# Patient Record
Sex: Female | Born: 1965 | Race: White | Hispanic: No | Smoking: Former smoker
Health system: Southern US, Community
[De-identification: ages and names within clinical notes are randomized; demographics above are authoritative.]

## PROBLEM LIST (undated history)

## (undated) DIAGNOSIS — Z87442 Personal history of urinary calculi: Secondary | ICD-10-CM

## (undated) DIAGNOSIS — K219 Gastro-esophageal reflux disease without esophagitis: Secondary | ICD-10-CM

## (undated) DIAGNOSIS — R011 Cardiac murmur, unspecified: Secondary | ICD-10-CM

## (undated) DIAGNOSIS — D649 Anemia, unspecified: Secondary | ICD-10-CM

## (undated) DIAGNOSIS — I10 Essential (primary) hypertension: Secondary | ICD-10-CM

## (undated) DIAGNOSIS — Z8489 Family history of other specified conditions: Secondary | ICD-10-CM

## (undated) DIAGNOSIS — T7840XA Allergy, unspecified, initial encounter: Secondary | ICD-10-CM

## (undated) HISTORY — PX: TUBAL LIGATION: SHX77

## (undated) HISTORY — DX: Cardiac murmur, unspecified: R01.1

## (undated) HISTORY — PX: HYSTEROSCOPY: SHX211

## (undated) HISTORY — DX: Allergy, unspecified, initial encounter: T78.40XA

## (undated) HISTORY — PX: TONSILLECTOMY: SUR1361

## (undated) HISTORY — PX: OOPHORECTOMY: SHX86

## (undated) HISTORY — PX: OTHER SURGICAL HISTORY: SHX169

## (undated) HISTORY — PX: CHOLECYSTECTOMY: SHX55

---

## 1998-05-24 ENCOUNTER — Encounter: Payer: Self-pay | Admitting: Emergency Medicine

## 1998-05-24 ENCOUNTER — Emergency Department (HOSPITAL_COMMUNITY): Admission: EM | Admit: 1998-05-24 | Discharge: 1998-05-24 | Payer: Self-pay | Admitting: Emergency Medicine

## 1998-12-18 ENCOUNTER — Other Ambulatory Visit: Admission: RE | Admit: 1998-12-18 | Discharge: 1998-12-18 | Payer: Self-pay | Admitting: Gynecology

## 1998-12-31 ENCOUNTER — Encounter: Payer: Self-pay | Admitting: Gynecology

## 1998-12-31 ENCOUNTER — Ambulatory Visit (HOSPITAL_COMMUNITY): Admission: RE | Admit: 1998-12-31 | Discharge: 1998-12-31 | Payer: Self-pay | Admitting: Gynecology

## 1999-01-20 ENCOUNTER — Inpatient Hospital Stay (HOSPITAL_COMMUNITY): Admission: RE | Admit: 1999-01-20 | Discharge: 1999-01-23 | Payer: Self-pay | Admitting: Gynecology

## 1999-01-20 ENCOUNTER — Encounter (INDEPENDENT_AMBULATORY_CARE_PROVIDER_SITE_OTHER): Payer: Self-pay

## 1999-08-05 ENCOUNTER — Encounter: Payer: Self-pay | Admitting: Gynecology

## 1999-08-05 ENCOUNTER — Encounter: Admission: RE | Admit: 1999-08-05 | Discharge: 1999-08-05 | Payer: Self-pay | Admitting: Gynecology

## 2000-04-25 ENCOUNTER — Other Ambulatory Visit: Admission: RE | Admit: 2000-04-25 | Discharge: 2000-04-25 | Payer: Self-pay | Admitting: Gynecology

## 2000-10-01 ENCOUNTER — Observation Stay (HOSPITAL_COMMUNITY): Admission: RE | Admit: 2000-10-01 | Discharge: 2000-10-02 | Payer: Self-pay | Admitting: Urology

## 2000-10-05 ENCOUNTER — Emergency Department (HOSPITAL_COMMUNITY): Admission: EM | Admit: 2000-10-05 | Discharge: 2000-10-05 | Payer: Self-pay | Admitting: Emergency Medicine

## 2000-10-05 ENCOUNTER — Encounter: Payer: Self-pay | Admitting: Emergency Medicine

## 2003-11-27 ENCOUNTER — Other Ambulatory Visit: Admission: RE | Admit: 2003-11-27 | Discharge: 2003-11-27 | Payer: Self-pay | Admitting: Gynecology

## 2004-11-27 ENCOUNTER — Other Ambulatory Visit: Admission: RE | Admit: 2004-11-27 | Discharge: 2004-11-27 | Payer: Self-pay | Admitting: Gynecology

## 2006-08-18 ENCOUNTER — Emergency Department (HOSPITAL_COMMUNITY): Admission: EM | Admit: 2006-08-18 | Discharge: 2006-08-18 | Payer: Self-pay | Admitting: Emergency Medicine

## 2008-08-26 ENCOUNTER — Ambulatory Visit: Payer: Self-pay | Admitting: Gynecology

## 2008-09-16 ENCOUNTER — Other Ambulatory Visit: Admission: RE | Admit: 2008-09-16 | Discharge: 2008-09-16 | Payer: Self-pay | Admitting: Gynecology

## 2008-09-16 ENCOUNTER — Encounter: Payer: Self-pay | Admitting: Gynecology

## 2008-09-16 ENCOUNTER — Ambulatory Visit: Payer: Self-pay | Admitting: Gynecology

## 2008-09-17 ENCOUNTER — Ambulatory Visit: Payer: Self-pay | Admitting: Gynecology

## 2008-10-07 ENCOUNTER — Ambulatory Visit: Payer: Self-pay | Admitting: Gynecology

## 2008-10-11 ENCOUNTER — Encounter: Payer: Self-pay | Admitting: Gynecology

## 2008-10-11 ENCOUNTER — Ambulatory Visit: Payer: Self-pay | Admitting: Gynecology

## 2008-10-11 ENCOUNTER — Ambulatory Visit (HOSPITAL_BASED_OUTPATIENT_CLINIC_OR_DEPARTMENT_OTHER): Admission: RE | Admit: 2008-10-11 | Discharge: 2008-10-11 | Payer: Self-pay | Admitting: Gynecology

## 2008-10-18 ENCOUNTER — Ambulatory Visit: Payer: Self-pay | Admitting: Gynecology

## 2010-05-01 ENCOUNTER — Other Ambulatory Visit (HOSPITAL_COMMUNITY)
Admission: RE | Admit: 2010-05-01 | Discharge: 2010-05-01 | Disposition: A | Discharge status: Home / Self Care | Payer: PRIVATE HEALTH INSURANCE | Source: Ambulatory Visit | Attending: Gynecology | Admitting: Gynecology

## 2010-05-01 ENCOUNTER — Encounter (INDEPENDENT_AMBULATORY_CARE_PROVIDER_SITE_OTHER): Payer: PRIVATE HEALTH INSURANCE | Admitting: Gynecology

## 2010-05-01 ENCOUNTER — Other Ambulatory Visit: Payer: Self-pay | Admitting: Gynecology

## 2010-05-01 DIAGNOSIS — N912 Amenorrhea, unspecified: Secondary | ICD-10-CM

## 2010-05-01 DIAGNOSIS — Z01419 Encounter for gynecological examination (general) (routine) without abnormal findings: Secondary | ICD-10-CM

## 2010-05-01 DIAGNOSIS — Z833 Family history of diabetes mellitus: Secondary | ICD-10-CM

## 2010-05-01 DIAGNOSIS — R809 Proteinuria, unspecified: Secondary | ICD-10-CM

## 2010-05-01 DIAGNOSIS — Z124 Encounter for screening for malignant neoplasm of cervix: Secondary | ICD-10-CM | POA: Insufficient documentation

## 2010-05-01 DIAGNOSIS — B373 Candidiasis of vulva and vagina: Secondary | ICD-10-CM

## 2010-05-01 DIAGNOSIS — Z13220 Encounter for screening for lipoid disorders: Secondary | ICD-10-CM

## 2010-05-01 DIAGNOSIS — B3731 Acute candidiasis of vulva and vagina: Secondary | ICD-10-CM

## 2010-05-04 ENCOUNTER — Other Ambulatory Visit: Payer: PRIVATE HEALTH INSURANCE

## 2010-05-04 ENCOUNTER — Ambulatory Visit (INDEPENDENT_AMBULATORY_CARE_PROVIDER_SITE_OTHER): Payer: PRIVATE HEALTH INSURANCE | Admitting: Gynecology

## 2010-05-04 ENCOUNTER — Other Ambulatory Visit: Payer: Self-pay | Admitting: Gynecology

## 2010-05-04 DIAGNOSIS — R1032 Left lower quadrant pain: Secondary | ICD-10-CM

## 2010-05-04 DIAGNOSIS — N882 Stricture and stenosis of cervix uteri: Secondary | ICD-10-CM

## 2010-05-04 DIAGNOSIS — N946 Dysmenorrhea, unspecified: Secondary | ICD-10-CM

## 2010-05-04 DIAGNOSIS — N921 Excessive and frequent menstruation with irregular cycle: Secondary | ICD-10-CM

## 2010-05-04 DIAGNOSIS — R1904 Left lower quadrant abdominal swelling, mass and lump: Secondary | ICD-10-CM

## 2010-07-08 ENCOUNTER — Other Ambulatory Visit: Payer: PRIVATE HEALTH INSURANCE

## 2010-07-08 ENCOUNTER — Ambulatory Visit (INDEPENDENT_AMBULATORY_CARE_PROVIDER_SITE_OTHER): Payer: PRIVATE HEALTH INSURANCE | Admitting: Gynecology

## 2010-07-08 DIAGNOSIS — N8300 Follicular cyst of ovary, unspecified side: Secondary | ICD-10-CM

## 2010-07-08 DIAGNOSIS — N926 Irregular menstruation, unspecified: Secondary | ICD-10-CM

## 2010-07-13 ENCOUNTER — Ambulatory Visit: Payer: PRIVATE HEALTH INSURANCE | Admitting: Gynecology

## 2010-07-13 ENCOUNTER — Other Ambulatory Visit: Payer: PRIVATE HEALTH INSURANCE

## 2010-07-14 NOTE — Op Note (Signed)
NAMESWAYZIE, CHOATE                ACCOUNT NO.:  [id number]   MEDICAL RECORD NO.:  [id number]          PATIENT TYPE:  AMB   LOCATION:  [facility]                         FACILITY:  [facility]   PHYSICIAN:  Timothy P. Fontaine, M.D.DATE OF BIRTH:  27-Jan-1966   DATE OF PROCEDURE:  10/11/2008  DATE OF DISCHARGE:                               OPERATIVE REPORT   PREOPERATIVE DIAGNOSES:  1. Menorrhagia.  2. Endometrial polyp.   POSTOPERATIVE DIAGNOSES:  1. Menorrhagia.  2. Endometrial polyp.   PROCEDURE:  Hysteroscopy, dilatation and curettage.   SURGEON:  Dr. Audie Box.   ANESTHETIC:  General with 1% lidocaine paracervical block.   COMPLICATIONS:  None.   ESTIMATED BLOOD LOSS:  Minimal.   SPECIMEN:  Endometrial curetting.   FINDINGS:  EUA:  External, BUS, vagina normal.  Cervix normal.  Uterus  normal size, midline and mobile.  Adnexa without masses.  Hysteroscopic  with fragments of endometrial tissue, no clear polyp visualized.  Hysteroscopy was adequate noting fundus, anterior and posterior uterine  surfaces, lower uterine segment, endocervical canal, right and left  tubal ostia all visualized.   PROCEDURE:  The patient was taken to the operating room, underwent  general anesthesia, was placed low dorsal lithotomy position, received a  perineal vaginal preparation with Betadine solution, and the bladder was  emptied with in-and-out Foley catheterization done in sterile technique  per nursing personnel.  An EUA was performed.  The patient draped in  usual fashion.  Cervix visualized with a weighted speculum.  Anterior  lip grasped with a single-tooth tenaculum and a paracervical block using  1% lidocaine was placed, a total of 8 mL.  Cervix was gently gradually  dilated to admit the operative hysteroscope, and hysteroscopy was  performed with findings noted above.  A sharp curettage was performed.  The specimen was sent to pathology, and on rehysteroscopy, the  endometrial cavity was  empty.  There was no remaining endometrial  fragments.  There was good distention and no evidence of  perforation.  The instruments were removed.  Adequate hemostasis was  visualized.  The patient placed in the supine position, awakened without  difficulty after receiving intraoperative Toradol and was taken to  recovery in good condition, having tolerated the procedure well.      Timothy P. Fontaine, M.D.  Electronically Signed     TPF/MEDQ  D:  10/11/2008  T:  10/11/2008  Job:  [id number]

## 2010-07-14 NOTE — H&P (Signed)
Emma Greene, Emma Greene                ACCOUNT NO.:  [id number]   MEDICAL RECORD NO.:  [id number]          PATIENT TYPE:  AMB   LOCATION:  [facility]                         FACILITY:  [facility]   PHYSICIAN:  Timothy P. Fontaine, M.D.DATE OF BIRTH:  09-Apr-1965   DATE OF ADMISSION:  DATE OF DISCHARGE:                              HISTORY & PHYSICAL   Being admitted to [facility] Friday, August 13 at 7:30  a.m.   CHIEF COMPLAINT:  Menorrhagia.   HISTORY OF PRESENT ILLNESS:  A 45 year old G2, P2 female status post  tubal sterilization, history of worsening menses.  The patient underwent  a sonohystogram which showed a 12 x 14 mm endometrial polyp.  The  patient is admitted for hysteroscopy removal of polyp, D and C due to  her menorrhagia.   PAST MEDICAL HISTORY:  Anxiety, depression, renal lithiasis.   PAST SURGICAL HISTORY:  Includes right salpingo-oophorectomy, left  ovarian cystectomy, BTL, cholecystectomy.   CURRENT MEDICATIONS:  None.   ALLERGIES:  No medication allergies.   REVIEW OF SYSTEMS:  Noncontributory.   FAMILY HISTORY:  Noncontributory.   SOCIAL HISTORY:  Noncontributory.   PHYSICAL EXAM:  VITAL SIGNS:  Afebrile.  Vital signs stable.  HEENT: Normal.  LUNGS:  Clear.  CARDIAC:  Regular rate.  No rubs, murmurs or gallops.  ABDOMINAL:  Exam benign.  PELVIC:  External BUS, vagina normal.  Cervix normal.  Uterus normal  size, midline mobile, nontender.  Adnexa without masses or tenderness.   ASSESSMENT:  A 45 year old G2, P2 female status post tubal  sterilization, worsening menorrhagia, sonohystogram suggestive of the  endometrial polyp for hysteroscopy, D and C, and removal of polyp.  I  reviewed the procedure with the patient to include the expected  intraoperative course use of the hysteroscope, resectoscope and D and C.  She understands there are no guarantees as far as menorrhagia relief.  Her menorrhagia may continue, worsen or change following the  procedure  and that we may have to proceed with a different plan long-term such as  endometrial ablation, possible IUD, hormonal manipulation or  hysterectomy.  I reviewed the risks of the procedure with her to include  bleeding, transfusion and risks of transfusion including transfusion  reaction, hepatitis, human immunodeficiency virus, mad cow disease and  other unknown entities.  The risks of infection requiring prolonged  antibiotics, abscess formation, reoperation abscess drainage and the  risks of uterine perforation, damage to internal organs including bowel,  bladder, ureters, vessels and nerves necessitating major exploratory  reparative surgeries, future reparative surgeries up to and including  ostomy formation, bladder repair, ureteral damage repair were all  reviewed with her.  The possibilities of distended media absorption  causing metabolic complications such as coma, seizures were also  reviewed, understood and accepted.  The patient's questions were  answered to her satisfaction.  She is ready to proceed with the surgery.      Timothy P. Fontaine, M.D.  Electronically Signed     TPF/MEDQ  D:  10/09/2008  T:  10/09/2008  Job:  [id number]

## 2010-12-16 LAB — URINALYSIS, ROUTINE W REFLEX MICROSCOPIC
Bilirubin Urine: NEGATIVE
Glucose, UA: NEGATIVE
Hgb urine dipstick: NEGATIVE
Ketones, ur: NEGATIVE
Nitrite: NEGATIVE
Protein, ur: NEGATIVE
Specific Gravity, Urine: 1.021
Urobilinogen, UA: 0.2
pH: 7

## 2010-12-16 LAB — I-STAT 8, (EC8 V) (CONVERTED LAB)
BUN: 9
Bicarbonate: 24.7 — ABNORMAL HIGH
Glucose, Bld: 90
pCO2, Ven: 37.8 — ABNORMAL LOW

## 2010-12-16 LAB — PROTIME-INR
INR: 0.9
Prothrombin Time: 12.6

## 2010-12-16 LAB — POCT CARDIAC MARKERS
Myoglobin, poc: 66
Operator id: 285841

## 2010-12-16 LAB — POCT PREGNANCY, URINE
Operator id: 285841
Preg Test, Ur: NEGATIVE

## 2010-12-16 LAB — D-DIMER, QUANTITATIVE: D-Dimer, Quant: 0.46

## 2011-05-17 ENCOUNTER — Observation Stay (HOSPITAL_COMMUNITY)
Admission: EM | Admit: 2011-05-17 | Discharge: 2011-05-18 | Disposition: A | Discharge status: Home / Self Care | Payer: BC Managed Care – PPO | Attending: Emergency Medicine | Admitting: Emergency Medicine

## 2011-05-17 ENCOUNTER — Emergency Department (HOSPITAL_COMMUNITY): Payer: BC Managed Care – PPO

## 2011-05-17 ENCOUNTER — Other Ambulatory Visit: Payer: Self-pay

## 2011-05-17 ENCOUNTER — Encounter (HOSPITAL_COMMUNITY): Payer: Self-pay

## 2011-05-17 DIAGNOSIS — R55 Syncope and collapse: Principal | ICD-10-CM | POA: Insufficient documentation

## 2011-05-17 DIAGNOSIS — R002 Palpitations: Secondary | ICD-10-CM | POA: Insufficient documentation

## 2011-05-17 DIAGNOSIS — R42 Dizziness and giddiness: Secondary | ICD-10-CM

## 2011-05-17 DIAGNOSIS — R0602 Shortness of breath: Secondary | ICD-10-CM | POA: Insufficient documentation

## 2011-05-17 DIAGNOSIS — R110 Nausea: Secondary | ICD-10-CM | POA: Insufficient documentation

## 2011-05-17 DIAGNOSIS — R5381 Other malaise: Secondary | ICD-10-CM | POA: Insufficient documentation

## 2011-05-17 HISTORY — DX: Anemia, unspecified: D64.9

## 2011-05-17 LAB — URINALYSIS, ROUTINE W REFLEX MICROSCOPIC
Bilirubin Urine: NEGATIVE
Ketones, ur: NEGATIVE mg/dL
Nitrite: NEGATIVE
Protein, ur: NEGATIVE mg/dL
Urobilinogen, UA: 1 mg/dL (ref 0.0–1.0)
pH: 6 (ref 5.0–8.0)

## 2011-05-17 LAB — POCT I-STAT TROPONIN I: Troponin i, poc: 0 ng/mL (ref 0.00–0.08)

## 2011-05-17 LAB — TROPONIN I: Troponin I: <0.3 ng/mL (ref <0.30)

## 2011-05-17 LAB — BASIC METABOLIC PANEL
CO2: 28 mEq/L (ref 19–32)
Calcium: 9.8 mg/dL (ref 8.4–10.5)
GFR calc Af Amer: >90 mL/min (ref >90)
GFR calc non Af Amer: >90 mL/min (ref >90)
Sodium: 139 mEq/L (ref 135–145)

## 2011-05-17 LAB — URINE MICROSCOPIC-ADD ON

## 2011-05-17 LAB — CBC
MCH: 25.5 pg — ABNORMAL LOW (ref 26.0–34.0)
Platelets: 248 10*3/uL (ref 150–400)
RBC: 4.74 MIL/uL (ref 3.87–5.11)
RDW: 15.3 % (ref 11.5–15.5)

## 2011-05-17 NOTE — ED Provider Notes (Signed)
History     CSN: [id number]  Arrival date & time 05/17/11  1258   First MD Initiated Contact with Patient 05/17/11 1539      Chief Complaint  Patient presents with  . Chest Pain    (Consider location/radiation/quality/duration/timing/severity/associated sxs/prior treatment) Patient is a 46 y.o. female presenting with chest pain. The history is provided by the patient.  Chest Pain Episode onset: 2 weeks ago, intermittent sharp pains, increasing  since sat  Duration of episode(s) is 5 minutes. Chest pain occurs intermittently. The chest pain is worsening. Associated with: SOB, nausea, dizziness, denies diaphoresis or association with exertion or breathing  At its most intense, the pain is at 5/10. The pain is currently at 0/10. The severity of the pain is moderate. The quality of the pain is described as sharp. The pain does not radiate. Exacerbated by: CP is not worsened or brought on by exertion or deep breathing  Primary symptoms include fatigue, shortness of breath, palpitations, nausea and dizziness. Pertinent negatives for primary symptoms include no fever, no syncope, no cough, no wheezing, no abdominal pain, no vomiting and no altered mental status.  The shortness of breath began 2 days ago. The shortness of breath developed with exertion. The shortness of breath is mild. The patient's medical history does not include CHF, COPD, asthma or chronic lung disease.  The palpitations began more than 1 week ago. The onset of the palpitations was gradual. An episode of palpitations lasts for less than 5 minutes. The palpitations have occurred more than 10 time(s). The palpitations occur while in bed and while anxious. The palpitations also occurred with dizziness and shortness of breath. The palpitations did not occur with syncope.   Dizziness also occurs with nausea. Dizziness does not occur with vomiting, weakness or diaphoresis.   Associated symptoms include near-syncope.  Pertinent  negatives for associated symptoms include no claudication, no diaphoresis, no lower extremity edema, no numbness, no orthopnea, no paroxysmal nocturnal dyspnea and no weakness. Risk factors include obesity.  Pertinent negatives for past medical history include no aneurysm, no anxiety/panic attacks, no aortic aneurysm, no aortic dissection, no arrhythmia, no bicuspid aortic valve, no CAD, no cancer, no congenital heart disease, no connective tissue disease, no COPD, no CHF, no diabetes, no DVT, no hyperhomocysteinemia, no hyperlipidemia, no hypertension, no MI, no mitral valve prolapse, no PE, no PVD, no rheumatic fever, no seizures, no stimulant use, no strokes, no thyroid problem, no TIA, Turner syndrome and no valve disorder.  Her family medical history is significant for CAD in family and early MI in family (MI at 37, mothers side of family ).  Pertinent negatives for family medical history include: no diabetes in family.  Procedure history is positive for echocardiogram (Years ago, told she had MVP, [facility]) and exercise treadmill test.  Procedure history is negative for cardiac catheterization and stress echo.     Past Medical History  Diagnosis Date  . Anemia     No past surgical history on file.  No family history on file.  History  Substance Use Topics  . Smoking status: Never Smoker   . Smokeless tobacco: Not on file  . Alcohol Use: No    OB History    Grav Para Term Preterm Abortions TAB SAB Ect Mult Living                  Review of Systems  Constitutional: Positive for fatigue. Negative for fever and diaphoresis.  Respiratory: Positive for shortness of  breath. Negative for cough and wheezing.   Cardiovascular: Positive for chest pain, palpitations and near-syncope. Negative for orthopnea, claudication and syncope.  Gastrointestinal: Positive for nausea. Negative for vomiting and abdominal pain.  Neurological: Positive for dizziness. Negative for seizures,  syncope, weakness and numbness.  Psychiatric/Behavioral: Negative for altered mental status.  All other systems reviewed and are negative.    Allergies  Review of patient's allergies indicates no known allergies.  Home Medications  No current outpatient prescriptions on file.  BP 126/68  Pulse 66  Temp(Src) 98.1 F (36.7 C) (Oral)  Resp 14  Ht 5\' 4"  (1.626 m)  Wt 280 lb (127.007 kg)  BMI 48.06 kg/m2  SpO2 99%  LMP 05/03/2011  Physical Exam  Nursing note and vitals reviewed. Constitutional: She appears well-developed and well-nourished. No distress.  HENT:  Head: Normocephalic and atraumatic.  Eyes: Conjunctivae and EOM are normal. Pupils are equal, round, and reactive to light.  Neck: Normal range of motion. Neck supple. Normal carotid pulses and no JVD present. Carotid bruit is not present. No rigidity. Normal range of motion present.  Cardiovascular: Normal rate, regular rhythm, S1 normal, S2 normal, normal heart sounds, intact distal pulses and normal pulses.  Exam reveals no gallop and no friction rub.   No murmur heard.      No pitting edema bilaterally, RRR, no aberrant sounds on auscultations, distal pulses intact, no carotid bruit or JVD.   Pulmonary/Chest: Effort normal and breath sounds normal. No accessory muscle usage or stridor. No respiratory distress. She exhibits no tenderness and no bony tenderness.  Abdominal: Bowel sounds are normal.       Obese, Soft non tender. Non pulsatile aorta.   Skin: Skin is warm, dry and intact. No rash noted. She is not diaphoretic. No cyanosis. Nails show no clubbing.    ED Course  Procedures (including critical care time)  Labs Reviewed  CBC - Abnormal; Notable for the following:    MCH 25.5 (*)    All other components within normal limits  URINALYSIS, ROUTINE W REFLEX MICROSCOPIC - Abnormal; Notable for the following:    Hgb urine dipstick TRACE (*)    Leukocytes, UA TRACE (*)    All other components within normal  limits  URINE MICROSCOPIC-ADD ON - Abnormal; Notable for the following:    Squamous Epithelial / LPF FEW (*)    Bacteria, UA MANY (*)    All other components within normal limits  BASIC METABOLIC PANEL  TROPONIN I  POCT PREGNANCY, URINE   Dg Chest 2 View  05/17/2011  *RADIOLOGY REPORT*  Clinical Data: Chest pain.  Shortness of breath.  Dizziness.  CHEST - 2 VIEW  Comparison: 08/18/2006  Findings: Heart size is normal.  Mediastinal shadows are normal. Lungs are clear.  No effusions.  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  IMPRESSION: No active cardiopulmonary disease.  Original Report Authenticated By: Thomasenia Sales, M.D.     No diagnosis found.    MDM  Chest pain  Pt moving to CDU on CP protocol. Discussed with Felicie Morn PA-C who will resume pt care. Pt does not qualify for CTAngio bc of BMI, pt can walk treadmill.   Pt does not qualify for stress test bc weight of 301 (limit is 300). She has had 3 negative troponin in the ED and is currently CP free. Cardiology has been consulted to sched an OP f-u appointment.  Spoke with [facility] cardiology who states they do not have  a record of pt, but they will be glad to see her as an OP. Pt had been advised to call [facility] AM to schedule OP follow up.           Jaci Carrel, [place] 05/17/11 2359

## 2011-05-17 NOTE — ED Notes (Signed)
Palpitationsand chest pain for several weeks, increase over last several days. sts hx of same pain in past and diagnosed with anemia.

## 2011-05-17 NOTE — ED Notes (Signed)
PT BMI IS 51.7.  THIS NURSE ADDRESSED PT ACTUAL WEIGHT WITH PAZ PA. PER THE VASCULAR LAB THEY ARE UNABLE TO LET PERSONS OVER 300 POUNDS WALK ON THE TREADMILL DUE TO SAFETY REASONS. PA IS GOING TO ADDRESS THIS ISSUE WITH DR Adriana Simas AND PROCEED. AT THIS TIME WILL CONTINUE TO MONITOR PT AND DRAW SERIAL TROPONINS.

## 2011-05-17 NOTE — ED Notes (Signed)
Reports intermittent episodes of palpitations & heart racing x 2 weeks except past few days has been feeling 20 episodes per day. States episodes last only a few seconds & sometimes has sharp CP in left side. States episodes can occur while at rest. Also c/o SOB with exertion & dizziness with activity. Denies presently.

## 2011-05-18 NOTE — ED Provider Notes (Signed)
Medical screening examination/treatment/procedure(s) were performed by non-physician practitioner and as supervising physician I was immediately available for consultation/collaboration.  Cardiac markers negative x3. We'll discharge home with close followup with cardiology  Donnetta Hutching, MD 05/18/11 0004

## 2011-05-18 NOTE — Discharge Instructions (Signed)
Call [facility] Cardiology first thing tomorrow morning to schedule a follow up apt this week for further work up. Read instructions below for reasons to return to the Emergency Department.   Chest Pain (Nonspecific)  HOME CARE INSTRUCTIONS  For the next few days, avoid physical activities that bring on chest pain. Continue physical activities as directed.  Do not smoke cigarettes or drink alcohol until your symptoms are gone.  Only take over-the-counter or prescription medicine for pain, discomfort, or fever as directed by your caregiver.  Follow your caregiver's suggestions for further testing if your chest pain does not go away.  Keep any follow-up appointments you made. If you do not go to an appointment, you could develop lasting (chronic) problems with pain. If there is any problem keeping an appointment, you must call to reschedule.  SEEK MEDICAL CARE IF:  You think you are having problems from the medicine you are taking. Read your medicine instructions carefully.  Your chest pain does not go away, even after treatment.  You develop a rash with blisters on your chest.  SEEK IMMEDIATE MEDICAL CARE IF:  You have increased chest pain or pain that spreads to your arm, neck, jaw, back, or belly (abdomen).  You develop shortness of breath, an increasing cough, or you are coughing up blood.  You have severe back or abdominal pain, feel sick to your stomach (nauseous) or throw up (vomit).  You develop severe weakness, fainting, or chills.  You have an oral temperature above 102 F (38.9 C), not controlled by medicine.   THIS IS AN EMERGENCY. Do not wait to see if the pain will go away. Get medical help at once. Call your local emergency services (911 in [place] Do not drive yourself to the hospital.   RESOURCE GUIDE  Dental Problems  Patients with Medicaid: [facility] [phone] [address]                                           [phone] [address]  [employer] Phone:  [phone]                                                  Phone:  [phone]  If unable to pay or uninsured, contact:  [facility] or [facility]. to become qualified for the adult dental clinic.  Chronic Pain Problems Contact [facility]  [phone] Patients need to be referred by their primary care doctor.  Insufficient Money for Medicine Contact [employer]:  call "211" or [facility] 726-608-5358.  No Primary Care Doctor Call [facility]  [phone] Other agencies that provide inexpensive medical care    [facility]  [phone]    [facility]  [phone]    [facility]  [phone]    [facility]  [phone]    [facility]  [phone]    [facility]  [phone]  Psychological Services [facility]  [phone] [facility]  [phone] [facility]   [phone] (emergency services [phone])  Substance Abuse Resources Alcohol and  Drug Services  [phone] [facility] 3206766899 [facility] 503-217-4211 Floydene Flock [phone] Residential & Outpatient Substance Abuse Program  903-560-3167  Abuse/Neglect [facility] Child Abuse Hotline 724-519-7313 [facility] Child Abuse Hotline (302)522-8439 (After Hours)  Emergency Shelter [facility] [phone]  Maternity Homes Room at the [place] [facility] [phone] [facility] [phone]  MRSA Hotline #:   (765)309-0173    [facility] Resources  [facility] [place]     [employer]                          [facility] [address] [place]                       [address]      [address] [place] [address]  Blondell Reveal Phone:  [phone]                                   Phone:  [phone]                  Phone:  [phone]  [facility] Phone:  [phone]  [facility] Child Abuse Hotline (445)717-3936 [phone] (After Hours)

## 2011-05-24 NOTE — Progress Notes (Signed)
Observation review is complete for 3/18 visit.

## 2011-12-07 ENCOUNTER — Telehealth: Payer: Self-pay | Admitting: *Deleted

## 2011-12-07 NOTE — Telephone Encounter (Signed)
Pt called c/o UTI, pt said she was out of town. Left on voicemail pt will need OV due infection. Pt overdue for annual.

## 2012-06-02 ENCOUNTER — Ambulatory Visit: Payer: BC Managed Care – PPO

## 2012-06-02 ENCOUNTER — Ambulatory Visit (INDEPENDENT_AMBULATORY_CARE_PROVIDER_SITE_OTHER): Payer: BC Managed Care – PPO | Admitting: Family Medicine

## 2012-06-02 VITALS — BP 126/80 | HR 82 | Temp 98.8°F | Resp 20 | Ht 64.0 in | Wt 303.0 lb

## 2012-06-02 DIAGNOSIS — M549 Dorsalgia, unspecified: Secondary | ICD-10-CM

## 2012-06-02 DIAGNOSIS — N912 Amenorrhea, unspecified: Secondary | ICD-10-CM

## 2012-06-02 MED ORDER — MELOXICAM 7.5 MG PO TABS: ORAL_TABLET | ORAL | Status: DC

## 2012-06-02 MED ORDER — KETOROLAC TROMETHAMINE 30 MG/ML IJ SOLN
30.0000 mg | Freq: Once | INTRAMUSCULAR | Status: AC
  Administered 2012-06-02: 30 mg via INTRAMUSCULAR

## 2012-06-02 MED ORDER — HYDROCODONE-ACETAMINOPHEN 5-325 MG PO TABS: 1.0000 | ORAL_TABLET | Freq: Three times a day (TID) | ORAL | Status: DC | PRN

## 2012-06-02 NOTE — Patient Instructions (Addendum)
Use the anti- inflammatory (mobic) as needed, and vicodin if needed for worse pain.  Remember the vicodin can cause drowsiness.

## 2012-06-02 NOTE — Progress Notes (Signed)
[facility] [address] [place] [address] [phone]  Date:  06/02/2012   Name:  Emma Greene   DOB:  Mar 21, 1965   MRN:  [id number]  PCP:  No primary provider on file.    Chief Complaint: Back Pain   History of Present Illness:  Emma Greene is a 47 y.o. very pleasant female patient who presents with the following:  She is here today with a concern about her back.  Over the last 3 months or so she has noted worsening pain. When she sits for a long time she has more pain, but she is now having trouble with pain when she walks as well.  She does not have weakness in her legs.  The pain is in her lower back, but also radiates to her sides bilaterally.  She did fall last year- down some stairs.  She did not have an evaluation, but notes some minor back problems since then.    No leg numbness, no bowel or bladder incontinence.   She is not comfortable in any position right now She has tried ibuprofen- this did not really help.  She has not taken anything yet today.   Her LMP was in October.  She thinks she might be going through menopause.  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  She is also s/p BTL.   There is no problem list on file for this patient.   Past Medical History  Diagnosis Date  . Anemia   . Allergy   . Heart murmur     Past Surgical History  Procedure Laterality Date  . Cholecystectomy      History  Substance Use Topics  . Smoking status: Never Smoker   . Smokeless tobacco: Not on file  . Alcohol Use: No    History reviewed. No pertinent family history.  No Known Allergies  Medication list has been reviewed and updated.  No current outpatient prescriptions on file prior to visit.   No current facility-administered medications on file prior to visit.    Review of Systems:  As per HPI- otherwise negative.  Physical Examination: Filed Vitals:    06/02/12 1707  BP: 126/80  Pulse: 82  Temp: 98.8 F (37.1 C)  Resp: 20   Filed Vitals:   06/02/12 1707  Height: 5\' 4"  (1.626 m)  Weight: 303 lb (137.44 kg)   Body mass index is 51.98 kg/(m^2). Ideal Body Weight: Weight in (lb) to have BMI = 25: 145.3  GEN: WDWN, NAD, Non-toxic, A & O x 3, morbid obesity, uncomfortable with pain HEENT: Atraumatic, Normocephalic. Neck supple. No masses, No LAD. Ears and Nose: No external deformity. CV: RRR, No M/G/R. No JVD. No thrill. No extra heart sounds. PULM: CTA B, no wheezes, crackles, rhonchi. No retractions. No resp. distress. No accessory muscle use. EXTR: No c/c/e NEURO Normal gait.   LE with normal strength, sensation and DTR.  Negative SLR test bilaterally PSYCH: Normally interactive. Conversant. Not depressed or anxious appearing.  Calm demeanor.  She notes pain in the left lumbar area of her back . She is able to flex but has more pain with extension.  The muscles in the right side of her lumbar area feel tight.    Recommended a pregnancy test today- low risk but we need to eliminate this possibility   [facility] reading (PRIMARY) by  Dr. Patsy Lager. Lumbar  spine: mild degenerative change, significant spurring at one level Thoracic spine: pt not able to lie down.  Mild degenerative change.   Quality limited by BMI of 52  LUMBAR SPINE - COMPLETE 4+ VIEW  Comparison: None  Findings: Exam detail diminished by patient's body habitus. The vertebral body heights are preserved. No fracture or subluxation. There is mild ventral endplate spurring and disc space narrowing. No acute fracture or subluxation identified.  IMPRESSION:  1. Mild lumbar spondylosis.  THORACIC SPINE - 2 VIEW  Comparison: None  Findings: Normal alignment of the thoracic spine. The vertebral body heights are well preserved. There is mild multilevel disc space narrowing and ventral spurring. No acute findings.  IMPRESSION: Mild thoracic spondylosis  noted.   Results for orders placed in visit on 06/02/12  POCT URINE PREGNANCY      Result Value Range   Preg Test, Ur Negative       Assessment and Plan: Back pain - Plan: ketorolac (TORADOL) 30 MG/ML injection 30 mg, DG Lumbar Spine Complete, DG Thoracic Spine 2 View, meloxicam (MOBIC) 7.5 MG tablet, HYDROcodone-acetaminophen (NORCO/VICODIN) 5-325 MG per tablet  Amenorrhea - Plan: POCT urine pregnancy  Toradol shot did help her feel better- she was able to sit comfortably and smiling in room. Will treat with mobic and vicodin as needed- she is to let me know if not feeling better in the next few days. She and her husband are working on weight loss which I encouraged.  If he pain is persistent we will think about ortho referral or PT to help strengthen her back   Signed Abbe Amsterdam, MD

## 2012-10-10 ENCOUNTER — Other Ambulatory Visit: Payer: Self-pay

## 2012-10-10 DIAGNOSIS — Z1231 Encounter for screening mammogram for malignant neoplasm of breast: Secondary | ICD-10-CM

## 2012-10-16 ENCOUNTER — Ambulatory Visit (INDEPENDENT_AMBULATORY_CARE_PROVIDER_SITE_OTHER): Payer: BC Managed Care – PPO | Admitting: Women's Health

## 2012-10-16 ENCOUNTER — Encounter: Payer: Self-pay | Admitting: Women's Health

## 2012-10-16 ENCOUNTER — Other Ambulatory Visit (HOSPITAL_COMMUNITY)
Admission: RE | Admit: 2012-10-16 | Discharge: 2012-10-16 | Disposition: A | Discharge status: Home / Self Care | Payer: BC Managed Care – PPO | Source: Ambulatory Visit | Attending: Gynecology | Admitting: Gynecology

## 2012-10-16 VITALS — BP 124/84 | Ht 64.5 in | Wt 294.0 lb

## 2012-10-16 DIAGNOSIS — Z833 Family history of diabetes mellitus: Secondary | ICD-10-CM

## 2012-10-16 DIAGNOSIS — Z01419 Encounter for gynecological examination (general) (routine) without abnormal findings: Secondary | ICD-10-CM

## 2012-10-16 DIAGNOSIS — N926 Irregular menstruation, unspecified: Secondary | ICD-10-CM

## 2012-10-16 DIAGNOSIS — Z113 Encounter for screening for infections with a predominantly sexual mode of transmission: Secondary | ICD-10-CM

## 2012-10-16 DIAGNOSIS — R300 Dysuria: Secondary | ICD-10-CM

## 2012-10-16 DIAGNOSIS — Z13220 Encounter for screening for lipoid disorders: Secondary | ICD-10-CM

## 2012-10-16 LAB — URINALYSIS W MICROSCOPIC + REFLEX CULTURE
Bilirubin Urine: NEGATIVE
Casts: NONE SEEN
Glucose, UA: NEGATIVE mg/dL
Leukocytes, UA: NEGATIVE
Protein, ur: NEGATIVE mg/dL
WBC, UA: NONE SEEN WBC/hpf (ref <3)
pH: 6 (ref 5.0–8.0)

## 2012-10-16 LAB — CBC WITH DIFFERENTIAL/PLATELET
Basophils Absolute: 0 10*3/uL (ref 0.0–0.1)
Basophils Relative: 0 % (ref 0–1)
Eosinophils Absolute: 0.1 10*3/uL (ref 0.0–0.7)
Eosinophils Relative: 1 % (ref 0–5)
MCH: 26.1 pg (ref 26.0–34.0)
MCHC: 33.6 g/dL (ref 30.0–36.0)
MCV: 77.5 fL — ABNORMAL LOW (ref 78.0–100.0)
Neutrophils Relative %: 70 % (ref 43–77)
Platelets: 242 10*3/uL (ref 150–400)
RBC: 4.45 MIL/uL (ref 3.87–5.11)
RDW: 15.7 % — ABNORMAL HIGH (ref 11.5–15.5)

## 2012-10-16 LAB — COMPREHENSIVE METABOLIC PANEL
ALT: 33 U/L (ref 0–35)
Alkaline Phosphatase: 68 U/L (ref 39–117)
CO2: 26 mEq/L (ref 19–32)
Creat: 0.67 mg/dL (ref 0.50–1.10)
Sodium: 138 mEq/L (ref 135–145)
Total Bilirubin: 0.4 mg/dL (ref 0.3–1.2)
Total Protein: 6.7 g/dL (ref 6.0–8.3)

## 2012-10-16 LAB — LIPID PANEL
Cholesterol: 192 mg/dL (ref 0–200)
LDL Cholesterol: 126 mg/dL — ABNORMAL HIGH (ref 0–99)
Total CHOL/HDL Ratio: 5.2 Ratio
VLDL: 29 mg/dL (ref 0–40)

## 2012-10-16 MED ORDER — NITROFURANTOIN MACROCRYSTAL 50 MG PO CAPS: ORAL_CAPSULE | ORAL | Status: DC

## 2012-10-16 NOTE — Progress Notes (Addendum)
Emma Greene [birth year] [id number]    History:    The patient presents for annual exam.  Irregular cycles every 2-6 months for the past year. History of normal Paps. Mammogram is scheduled, normal baseline in 2001. Benign polyp removed hysteroscopic - 2010. 12/1998 Bilateral dermoids with RSO  with partial left ovary removed and left tube tied. Morbid obesity. New partner.   Past medical history, past surgical history, family history and social history were all reviewed and documented in the EPIC chart. Emergency planning/management officer for [employer]. Parents hypertension. Erin 23, Ian 16 both doing well.    ROS:  A  ROS was performed and pertinent positives and negatives are included in the history.  Exam:  Filed Vitals:   10/16/12 0912  BP: 124/84    General appearance:  Normal Head/Neck:  Normal, without cervical or supraclavicular adenopathy. Thyroid:  Symmetrical, normal in size, without palpable masses or nodularity. Respiratory  Effort:  Normal  Auscultation:  Clear without wheezing or rhonchi Cardiovascular  Auscultation:  Regular rate, without rubs, murmurs or gallops  Edema/varicosities:  Not grossly evident Abdominal  Soft,nontender, without masses, guarding or rebound.  Liver/spleen:  No organomegaly noted  Hernia:  None appreciated  Skin  Inspection:  Grossly normal  Palpation:  Grossly normal Neurologic/psychiatric  Orientation:  Normal with appropriate conversation.  Mood/affect:  Normal  Genitourinary    Breasts: Examined lying and sitting.     Right: Without masses, retractions, discharge or axillary adenopathy.     Left: Without masses, retractions, discharge or axillary adenopathy.   Inguinal/mons:  Normal without inguinal adenopathy  External genitalia:  Normal  BUS/Urethra/Skene's glands:  Normal  Bladder:  Normal  Vagina:  Normal  Cervix:  Normal  Uterus:   normal in size, shape and contour.  Midline and mobile  Adnexa/parametria:     Rt: Without masses or  tenderness.   Lt: Without masses or tenderness.  Anus and perineum: Normal  Digital rectal exam: Normal sphincter tone without palpated masses or tenderness  Assessment/Plan:  47 y.o. DWF G3P2 for annual exam with increased UTIs mostly associated with intercourse.    Irregular cycles STD screen Morbid obesity  Plan: Macrodantin 50 mg with coitus, office visit for UA if symptomatic. SBE's, keep scheduled mammogram appointment, reviewed importance of annual screen. Discuss increasing exercise, Weight Watchers, weight loss surgery, importance of decreasing weight  for health. Calcium rich diet, vitamin D 1000 daily encouraged. CBC, C. MET, lipid panel, TSH, prolactin, UA, GC/Chlamydia, HIV, hep B, C., RPR, Pap. Instructed to call if cycles space greater than 3 months or if has spotting.   Harrington Challenger [facility], 9:53 AM 10/16/2012

## 2012-10-16 NOTE — Addendum Note (Signed)
Addended by: Richardson Chiquito on: 10/16/2012 10:55 AM   Modules accepted: Orders

## 2012-10-16 NOTE — Patient Instructions (Signed)

## 2012-10-17 LAB — PROLACTIN: Prolactin: 9.4 ng/mL

## 2012-10-17 LAB — HIV ANTIBODY (ROUTINE TESTING W REFLEX): HIV: NONREACTIVE

## 2012-10-17 LAB — HEPATITIS C ANTIBODY: HCV Ab: NEGATIVE

## 2012-10-20 ENCOUNTER — Other Ambulatory Visit: Payer: Self-pay | Admitting: Women's Health

## 2012-10-20 LAB — URINE CULTURE: Colony Count: 50000

## 2012-10-20 MED ORDER — NITROFURANTOIN MONOHYD MACRO 100 MG PO CAPS: 100.0000 mg | ORAL_CAPSULE | Freq: Two times a day (BID) | ORAL | Status: DC

## 2012-10-23 ENCOUNTER — Ambulatory Visit
Admission: RE | Admit: 2012-10-23 | Discharge: 2012-10-23 | Disposition: A | Discharge status: Home / Self Care | Payer: BC Managed Care – PPO | Source: Ambulatory Visit

## 2012-10-23 DIAGNOSIS — Z1231 Encounter for screening mammogram for malignant neoplasm of breast: Secondary | ICD-10-CM

## 2013-12-31 ENCOUNTER — Encounter: Payer: Self-pay | Admitting: Women's Health

## 2014-06-06 ENCOUNTER — Other Ambulatory Visit: Payer: Self-pay

## 2014-06-06 DIAGNOSIS — Z1231 Encounter for screening mammogram for malignant neoplasm of breast: Secondary | ICD-10-CM

## 2014-06-10 ENCOUNTER — Ambulatory Visit
Admission: RE | Admit: 2014-06-10 | Discharge: 2014-06-10 | Disposition: A | Discharge status: Home / Self Care | Payer: BLUE CROSS/BLUE SHIELD | Source: Ambulatory Visit

## 2014-06-10 DIAGNOSIS — Z1231 Encounter for screening mammogram for malignant neoplasm of breast: Secondary | ICD-10-CM

## 2014-08-20 ENCOUNTER — Ambulatory Visit (INDEPENDENT_AMBULATORY_CARE_PROVIDER_SITE_OTHER): Payer: BLUE CROSS/BLUE SHIELD | Admitting: Emergency Medicine

## 2014-08-20 VITALS — BP 130/88 | HR 91 | Temp 98.3°F | Resp 20 | Ht 64.0 in | Wt 319.2 lb

## 2014-08-20 DIAGNOSIS — S335XXA Sprain of ligaments of lumbar spine, initial encounter: Secondary | ICD-10-CM | POA: Diagnosis not present

## 2014-08-20 MED ORDER — CYCLOBENZAPRINE HCL 10 MG PO TABS: 10.0000 mg | ORAL_TABLET | Freq: Three times a day (TID) | ORAL | Status: DC | PRN

## 2014-08-20 MED ORDER — NAPROXEN SODIUM 550 MG PO TABS: 550.0000 mg | ORAL_TABLET | Freq: Two times a day (BID) | ORAL | Status: AC

## 2014-08-20 MED ORDER — HYDROCODONE-ACETAMINOPHEN 5-325 MG PO TABS: 1.0000 | ORAL_TABLET | ORAL | Status: DC | PRN

## 2014-08-20 NOTE — Patient Instructions (Signed)

## 2014-08-20 NOTE — Progress Notes (Signed)
Subjective:  Patient ID: Emma Greene, female    DOB: Apr 29, 1965  Age: 49 y.o. MRN: [id number]  CC: Back Pain   HPI [name] presents  with back pain. She said she went swimming in a pull over the weekend and when she got home she sat down and difficulty getting up she's had worsening pain over the last 2 days she denies any radicular pain no numbness tingling or weakness. She has no history of direct injury or overuse. She's been taking left over medication with no improvement.  History Cathye has a past medical history of Anemia; Allergy; Heart murmur; and Arthritis.   She has past surgical history that includes Cholecystectomy; right salpingo-oophorrectomy; Oophorectomy; Tubal ligation; and Hysteroscopy.   Her  family history includes Cancer in her maternal grandfather; Heart disease in her father, maternal grandmother, paternal grandfather, and paternal grandmother; Hyperlipidemia in her maternal grandmother and mother; Hypertension in her brother, maternal grandmother, and mother.  She   reports that she has never smoked. She has never used smokeless tobacco. She reports that she does not drink alcohol or use illicit drugs.  Outpatient Prescriptions Prior to Visit  Medication Sig Dispense Refill  . nitrofurantoin (MACRODANTIN) 50 MG capsule Take as directed as needed 30 capsule 0  . Iron TABS Take by mouth.    . nitrofurantoin, macrocrystal-monohydrate, (MACROBID) 100 MG capsule Take 1 capsule (100 mg total) by mouth 2 (two) times daily. (Patient not taking: Reported on 08/20/2014) 14 capsule 0   No facility-administered medications prior to visit.    History   Social History  . Marital Status: Legally Separated    Spouse Name: N/A  . Number of Children: N/A  . Years of Education: N/A   Social History Main Topics  . Smoking status: Never Smoker   . Smokeless tobacco: Never Used  . Alcohol Use: No  . Drug Use: No  . Sexual Activity: Yes   Other Topics  Concern  . None   Social History Narrative     Review of Systems  Constitutional: Negative for fever, chills and appetite change.  HENT: Negative for congestion, ear pain, postnasal drip, sinus pressure and sore throat.   Eyes: Negative for pain and redness.  Respiratory: Negative for cough, shortness of breath and wheezing.   Cardiovascular: Negative for leg swelling.  Gastrointestinal: Negative for nausea, vomiting, abdominal pain, diarrhea, constipation and blood in stool.  Endocrine: Negative for polyuria.  Genitourinary: Negative for dysuria, urgency, frequency and flank pain.  Musculoskeletal: Positive for back pain. Negative for gait problem.  Skin: Negative for rash.  Neurological: Negative for weakness and headaches.  Psychiatric/Behavioral: Negative for confusion and decreased concentration. The patient is not nervous/anxious.     Objective:  BP 130/88 mmHg  Pulse 91  Temp(Src) 98.3 F (36.8 C) (Oral)  Resp 20  Ht 5\' 4"  (1.626 m)  Wt 319 lb 4 oz (144.811 kg)  BMI 54.77 kg/m2  SpO2 98%  LMP 04/01/2014  Physical Exam  Constitutional: She is oriented to person, place, and time. She appears well-developed and well-nourished.  HENT:  Head: Normocephalic and atraumatic.  Eyes: Conjunctivae are normal. Pupils are equal, round, and reactive to light.  Pulmonary/Chest: Effort normal.  Musculoskeletal: Normal range of motion. She exhibits no edema.       Lumbar back: She exhibits tenderness and spasm.  Neurological: She is alert and oriented to person, place, and time.  Skin: Skin is dry.  Psychiatric: She has a normal mood  and affect. Her behavior is normal. Thought content normal.   patient is morbidly obese. And in moderate distress    Assessment & Plan:   [name] was seen today for back pain.  Diagnoses and all orders for this visit:  Sprain of lumbar region, initial encounter  Other orders -     naproxen sodium (ANAPROX DS) 550 MG tablet; Take 1 tablet  (550 mg total) by mouth 2 (two) times daily with a meal. -     cyclobenzaprine (FLEXERIL) 10 MG tablet; Take 1 tablet (10 mg total) by mouth 3 (three) times daily as needed for muscle spasms. -     HYDROcodone-acetaminophen (NORCO) 5-325 MG per tablet; Take 1-2 tablets by mouth every 4 (four) hours as needed.   I am having Ms. Cable start on naproxen sodium, cyclobenzaprine, and HYDROcodone-acetaminophen. I am also having her maintain her Iron, nitrofurantoin, and nitrofurantoin (macrocrystal-monohydrate).  Meds ordered this encounter  Medications  . naproxen sodium (ANAPROX DS) 550 MG tablet    Sig: Take 1 tablet (550 mg total) by mouth 2 (two) times daily with a meal.    Dispense:  40 tablet    Refill:  0  . cyclobenzaprine (FLEXERIL) 10 MG tablet    Sig: Take 1 tablet (10 mg total) by mouth 3 (three) times daily as needed for muscle spasms.    Dispense:  30 tablet    Refill:  0  . HYDROcodone-acetaminophen (NORCO) 5-325 MG per tablet    Sig: Take 1-2 tablets by mouth every 4 (four) hours as needed.    Dispense:  30 tablet    Refill:  0    Appropriate red flag conditions were discussed with the patient as well as actions that should be taken.  Patient expressed his understanding.  Follow-up: Return if symptoms worsen or fail to improve.  Roselee Culver, MD

## 2014-08-21 ENCOUNTER — Telehealth: Payer: Self-pay | Admitting: Family Medicine

## 2014-08-21 NOTE — Telephone Encounter (Signed)
Spoke to pt, scheduled her for 815am tomorrow.

## 2014-08-21 NOTE — Telephone Encounter (Signed)
Emma Greene has called back. Please call him asap once you hear something.

## 2014-08-21 NOTE — Telephone Encounter (Signed)
Emma Greene is a new patient, she is a friend of one of your patients Lanny Hurst Chrismon on  [birth year]) he referred her to you , she is having bad back pain, and bone spurs, she is in really bad pain as we speak, she went to the ER and nothing was done to help her, so her friend would like to know if you could find it in your heart to see her today or tomorrow please.  Please advise.  [phone]

## 2014-08-22 ENCOUNTER — Ambulatory Visit (INDEPENDENT_AMBULATORY_CARE_PROVIDER_SITE_OTHER): Payer: BLUE CROSS/BLUE SHIELD | Admitting: Family Medicine

## 2014-08-22 ENCOUNTER — Encounter: Payer: Self-pay | Admitting: Family Medicine

## 2014-08-22 VITALS — BP 130/84 | HR 76 | Ht 64.0 in | Wt 317.0 lb

## 2014-08-22 DIAGNOSIS — S39012A Strain of muscle, fascia and tendon of lower back, initial encounter: Secondary | ICD-10-CM | POA: Diagnosis not present

## 2014-08-22 MED ORDER — KETOROLAC TROMETHAMINE 60 MG/2ML IM SOLN
60.0000 mg | Freq: Once | INTRAMUSCULAR | Status: AC
  Administered 2014-08-22: 60 mg via INTRAMUSCULAR

## 2014-08-22 MED ORDER — METHYLPREDNISOLONE ACETATE 80 MG/ML IJ SUSP
80.0000 mg | Freq: Once | INTRAMUSCULAR | Status: AC
  Administered 2014-08-22: 80 mg via INTRAMUSCULAR

## 2014-08-22 NOTE — Progress Notes (Signed)
Pre visit review using our clinic review tool, if applicable. No additional management support is needed unless otherwise documented below in the visit note. 

## 2014-08-22 NOTE — Assessment & Plan Note (Signed)
Likely strain no radicular symptoms, no bowel or bladder problems. This seems to be very localized. At this point we'll treated more as a strain. Patient will continue anti-inflammatory twice daily for the next 10 days, patient given 2 injections today to help with the pain. We discussed over-the-counter medications in patient did learn home exercises as well. Patient will try to make these changes and come back and see me again in 1-2 weeks to make sure that this is responded. If not then we'll need to consider imaging. Patient given symptoms that would need to be seen by another medical provider sooner.

## 2014-08-22 NOTE — Patient Instructions (Signed)
Good to see you Ice 20 minutes 2 times daily. Usually after activity and before bed. Exercises 3 times a week.  Naproxen 2 times a day for 10 days Flexeril at night Fish oil 3 grams daily See me again in 7-10 days

## 2014-08-22 NOTE — Progress Notes (Signed)
[facility] [address] [place], [place] [address] Phone: [phone] Subjective:     CC: Back pain  Emma Greene is a 49 y.o. female coming in with complaint of back pain. Patient states during this weekend patient said having lower back pain. Patient states that this occurred after swimming. Patient states that she sat for quite some time and when she went up to get up she had significant trouble. Patient unfortunately has a much pain she had to go to urgent care. At urgent care patient was diagnosed with a strain and was given muscle relaxers, anti-inflammatory's. Patient states that she has not made any significant improvement. Patient denies any radiation down the legs any numbness or weakness. States that it seems to be tight all the time and worse when she changes positions such as going from a seated to standing position. Patient rates the severity of pain is 8 out of 10. Patient states that the muscle relaxer does help with the pain but makes her feel very dizzy. Denies any fever, chills, or any abnormal weight loss. Had an exacerbation of back pain greater than 2 years ago.  Past Medical History  Diagnosis Date  . Anemia   . Allergy   . Heart murmur   . Arthritis    Past Surgical History  Procedure Laterality Date  . Cholecystectomy    . Right salpingo-oophorrectomy    . Oophorectomy    . Tubal ligation    . Hysteroscopy      with D&C   History  Substance Use Topics  . Smoking status: Never Smoker   . Smokeless tobacco: Never Used  . Alcohol Use: No   No Known Allergies Family History  Problem Relation Age of Onset  . Heart disease Father   . Cancer Maternal Grandfather     lung- smoker  . Hyperlipidemia Mother   . Hypertension Mother   . Hypertension Brother   . Heart disease Maternal Grandmother   . Hyperlipidemia Maternal Grandmother   . Hypertension Maternal Grandmother   . Heart disease Paternal Grandmother     . Heart disease Paternal Grandfather         Past medical history, social, surgical and family history all reviewed in electronic medical record.   Review of Systems: No headache, visual changes, nausea, vomiting, diarrhea, constipation, dizziness, abdominal pain, skin rash, fevers, chills, night sweats, weight loss, swollen lymph nodes, body aches, joint swelling, muscle aches, chest pain, shortness of breath, mood changes.   Objective Blood pressure 130/84, pulse 76, height 5\' 4"  (1.626 m), weight 317 lb (143.79 kg), last menstrual period 04/01/2014, SpO2 97 %.  General: No apparent distress alert and oriented x3 mood and affect normal, dressed appropriately. Obese HEENT: Pupils equal, extraocular movements intact  Respiratory: Patient's speak in full sentences and does not appear short of breath  Cardiovascular: No lower extremity edema, non tender, no erythema  Skin: Warm dry intact with no signs of infection or rash on extremities or on axial skeleton.  Abdomen: Soft nontender  Neuro: Cranial nerves II through XII are intact, neurovascularly intact in all extremities with 2+ DTRs and 2+ pulses.  Lymph: No lymphadenopathy of posterior or anterior cervical chain or axillae bilaterally.  Gait normal with good balance and coordination.  MSK:  Non tender with full range of motion and good stability and symmetric strength and tone of shoulders, elbows, wrist, hip, knee and ankles bilaterally.  Back Exam:  Inspection: Unremarkable  Motion: Flexion 25 deg, Extension 15 deg, Side Bending to 25 deg bilaterally,  Rotation to 25 deg bilaterally  SLR laying: Negative  XSLR laying: Negative  Palpable tenderness: Or tenderness to palpation in the lumbar sacral area bilaterally and the paraspinal musculature with no spinous process tenderness FABER: Positive bilaterally Sensory change: Gross sensation intact to all lumbar and sacral dermatomes.  Reflexes: 2+ at both patellar tendons, 2+ at  achilles tendons, Babinski's downgoing.  Strength at foot  Plantar-flexion: 5/5 Dorsi-flexion: 5/5 Eversion: 5/5 Inversion: 5/5  Leg strength  Quad: 5/5 Hamstring: 4/5 Hip flexor: 4/5 Hip abductors: 4/5 symmetric Gait unremarkable. Procedure note 50388; 15 minutes spent for Therapeutic exercises as stated in above notes.  This included exercises focusing on stretching, strengthening, with significant focus on eccentric aspects. Low back exercises that included:  Pelvic tilt/bracing instruction to focus on control of the pelvic girdle and lower abdominal muscles  Glute strengthening exercises, focusing on proper firing of the glutes without engaging the low back muscles Proper stretching techniques for maximum relief for the hamstrings, hip flexors, low back and some rotation where tolerated  Proper technique shown and discussed handout in great detail with ATC.  All questions were discussed and answered.     Impression and Recommendations:     This case required medical decision making of moderate complexity.

## 2014-09-12 ENCOUNTER — Ambulatory Visit: Payer: BLUE CROSS/BLUE SHIELD | Admitting: Family Medicine

## 2014-09-27 ENCOUNTER — Ambulatory Visit (INDEPENDENT_AMBULATORY_CARE_PROVIDER_SITE_OTHER): Payer: BLUE CROSS/BLUE SHIELD | Admitting: Internal Medicine

## 2014-09-27 ENCOUNTER — Encounter: Payer: Self-pay | Admitting: Internal Medicine

## 2014-09-27 ENCOUNTER — Other Ambulatory Visit (INDEPENDENT_AMBULATORY_CARE_PROVIDER_SITE_OTHER): Payer: BLUE CROSS/BLUE SHIELD

## 2014-09-27 VITALS — BP 164/86 | HR 84 | Temp 98.2°F | Resp 18 | Ht 64.0 in | Wt 318.0 lb

## 2014-09-27 DIAGNOSIS — M609 Myositis, unspecified: Secondary | ICD-10-CM

## 2014-09-27 DIAGNOSIS — IMO0001 Reserved for inherently not codable concepts without codable children: Secondary | ICD-10-CM

## 2014-09-27 DIAGNOSIS — M791 Myalgia, unspecified site: Secondary | ICD-10-CM | POA: Insufficient documentation

## 2014-09-27 LAB — HEMOGLOBIN A1C: HEMOGLOBIN A1C: 5.5 % (ref 4.6–6.5)

## 2014-09-27 MED ORDER — DULOXETINE HCL 30 MG PO CPEP: 30.0000 mg | ORAL_CAPSULE | Freq: Every day | ORAL | Status: DC

## 2014-09-27 NOTE — Progress Notes (Signed)
   Subjective:    Patient ID: Emma Greene, female    DOB: 1965-03-04, 49 y.o.   MRN: [id number]  HPI The patient is a 49 YO female who is coming in today for myalgias. Her mother was diagnosed with fibromyalgia and feels that she has the same. She has been struggling with it for years (20+) but has never really sought care for it. She has been having worsening symptoms in the last several years with more persistent pains and muscle aches. They are worsened with stress and she is having a lot of stress right now in her life. Her youngest is able to leave for college and her job is very stressful. Denies any rashes associated or weight change. She does not exercise. She does also have stomach problems with alternative diarrhea and constipation which go in streaks. No depression or anxiety although she does have stress. She tends toward insomnia but sleeps okay.   PMH, [facility], social history reviewed and updated.   Review of Systems  Constitutional: Positive for activity change and fatigue. Negative for fever, appetite change and unexpected weight change.  HENT: Negative.   Eyes: Negative.   Respiratory: Negative for cough, chest tightness, shortness of breath and wheezing.   Cardiovascular: Negative for chest pain, palpitations and leg swelling.  Gastrointestinal: Positive for diarrhea and constipation. Negative for nausea, vomiting, abdominal pain and abdominal distention.  Musculoskeletal: Positive for myalgias, back pain and arthralgias. Negative for gait problem.  Skin: Negative.   Neurological: Negative.   Psychiatric/Behavioral: Negative.       Objective:   Physical Exam  Constitutional: She is oriented to person, place, and time. She appears well-developed and well-nourished.  Overweight  HENT:  Head: Normocephalic and atraumatic.  Eyes: EOM are normal.  Neck: Normal range of motion.  Cardiovascular: Normal rate and regular rhythm.   Pulmonary/Chest: Effort normal and breath sounds  normal. No respiratory distress. She has no wheezes. She has no rales.  Abdominal: Soft. Bowel sounds are normal. She exhibits no distension. There is no tenderness. There is no rebound.  Musculoskeletal:  Multiple tender points.   Neurological: She is alert and oriented to person, place, and time. Coordination normal.  Skin: Skin is warm and dry.  Psychiatric: She has a normal mood and affect.   Filed Vitals:   09/27/14 1310  BP: 164/86  Pulse: 84  Temp: 98.2 F (36.8 C)  TempSrc: Oral  Resp: 18  Height: 5\' 4"  (1.626 m)  Weight: 318 lb (144.244 kg)  SpO2: 98%      Assessment & Plan:

## 2014-09-27 NOTE — Assessment & Plan Note (Signed)
Although no joint swelling on exam checking ANA, rheumatoid factor, HgA1c to look for additional cause before assuming diagnosis of fibromyalgia. Will trial cymbalta for pain management. Talked to her about the need for exercise and weight loss to help with pain. Information given to her about fibromyalgia today.

## 2014-09-27 NOTE — Progress Notes (Signed)
Pre visit review using our clinic review tool, if applicable. No additional management support is needed unless otherwise documented below in the visit note. 

## 2014-09-27 NOTE — Assessment & Plan Note (Signed)
Checking for complications, recently had lipids checked at work with no reported problems. Talked to her about the importance of diet and exercise to keep her health.

## 2014-09-27 NOTE — Patient Instructions (Signed)
We will check some labs today and call you back with the results.   We have sent in cymbalta (duloxetine) which is a medicine that helps the nerves to stop some of the abnormal pain signals. We have sent in the prescription for 30 mg and you will take 1 pill daily if you decide to try it. The usual full strength dose is 60 mg daily so if in 4 weeks you are not having any side effects and still having pains we can think about increasing to 60 mg daily. Call us if you would like to do this and we will send in the higher dose prescription.   We will see you back in about 3-6 months.

## 2014-09-28 LAB — RHEUMATOID FACTOR: Rhuematoid fact SerPl-aCnc: <10 IU/mL (ref <=14)

## 2014-09-30 LAB — ANA: Anti Nuclear Antibody(ANA): NEGATIVE

## 2014-10-02 ENCOUNTER — Telehealth: Payer: Self-pay | Admitting: Internal Medicine

## 2014-10-02 NOTE — Telephone Encounter (Signed)
Patient returned your call, she will be in a meeting at 3:00 call before three or after four.

## 2015-02-14 ENCOUNTER — Other Ambulatory Visit (HOSPITAL_COMMUNITY)
Admission: RE | Admit: 2015-02-14 | Discharge: 2015-02-14 | Disposition: A | Discharge status: Home / Self Care | Payer: BLUE CROSS/BLUE SHIELD | Source: Ambulatory Visit | Attending: Women's Health | Admitting: Women's Health

## 2015-02-14 ENCOUNTER — Encounter: Payer: Self-pay | Admitting: Women's Health

## 2015-02-14 ENCOUNTER — Ambulatory Visit (INDEPENDENT_AMBULATORY_CARE_PROVIDER_SITE_OTHER): Payer: BLUE CROSS/BLUE SHIELD | Admitting: Women's Health

## 2015-02-14 VITALS — BP 132/80 | Ht 64.0 in | Wt 328.0 lb

## 2015-02-14 DIAGNOSIS — Z1151 Encounter for screening for human papillomavirus (HPV): Secondary | ICD-10-CM | POA: Diagnosis not present

## 2015-02-14 DIAGNOSIS — N912 Amenorrhea, unspecified: Secondary | ICD-10-CM | POA: Diagnosis not present

## 2015-02-14 DIAGNOSIS — Z01419 Encounter for gynecological examination (general) (routine) without abnormal findings: Secondary | ICD-10-CM

## 2015-02-14 NOTE — Addendum Note (Signed)
Addended by: Burnett Kanaris on: 02/14/2015 11:23 AM   Modules accepted: Orders

## 2015-02-14 NOTE — Patient Instructions (Signed)
Health Maintenance, Female Adopting a healthy lifestyle and getting preventive care can go a long way to promote health and wellness. Talk with your health care provider about what schedule of regular examinations is right for you. This is a good chance for you to check in with your provider about disease prevention and staying healthy. In between checkups, there are plenty of things you can do on your own. Experts have done a lot of research about which lifestyle changes and preventive measures are most likely to keep you healthy. Ask your health care provider for more information. WEIGHT AND DIET  Eat a healthy diet  Be sure to include plenty of vegetables, fruits, low-fat dairy products, and lean protein.  Do not eat a lot of foods high in solid fats, added sugars, or salt.  Get regular exercise. This is one of the most important things you can do for your health.  Most adults should exercise for at least 150 minutes each week. The exercise should increase your heart rate and make you sweat (moderate-intensity exercise).  Most adults should also do strengthening exercises at least twice a week. This is in addition to the moderate-intensity exercise.  Maintain a healthy weight  Body mass index (BMI) is a measurement that can be used to identify possible weight problems. It estimates body fat based on height and weight. Your health care provider can help determine your BMI and help you achieve or maintain a healthy weight.  For females 20 years of age and older:   A BMI below 18.5 is considered underweight.  A BMI of 18.5 to 24.9 is normal.  A BMI of 25 to 29.9 is considered overweight.  A BMI of 30 and above is considered obese.  Watch levels of cholesterol and blood lipids  You should start having your blood tested for lipids and cholesterol at 48 years of age, then have this test every 5 years.  You may need to have your cholesterol levels checked more often if:  Your lipid  or cholesterol levels are high.  You are older than 48 years of age.  You are at high risk for heart disease.  CANCER SCREENING   Lung Cancer  Lung cancer screening is recommended for adults 55-80 years old who are at high risk for lung cancer because of a history of smoking.  A yearly low-dose CT scan of the lungs is recommended for people who:  Currently smoke.  Have quit within the past 15 years.  Have at least a 30-pack-year history of smoking. A pack year is smoking an average of one pack of cigarettes a day for 1 year.  Yearly screening should continue until it has been 15 years since you quit.  Yearly screening should stop if you develop a health problem that would prevent you from having lung cancer treatment.  Breast Cancer  Practice breast self-awareness. This means understanding how your breasts normally appear and feel.  It also means doing regular breast self-exams. Let your health care provider know about any changes, no matter how small.  If you are in your 20s or 30s, you should have a clinical breast exam (CBE) by a health care provider every 1-3 years as part of a regular health exam.  If you are 40 or older, have a CBE every year. Also consider having a breast X-ray (mammogram) every year.  If you have a family history of breast cancer, talk to your health care provider about genetic screening.  If you   are at high risk for breast cancer, talk to your health care provider about having an MRI and a mammogram every year.  Breast cancer gene (BRCA) assessment is recommended for women who have family members with BRCA-related cancers. BRCA-related cancers include:  Breast.  Ovarian.  Tubal.  Peritoneal cancers.  Results of the assessment will determine the need for genetic counseling and BRCA1 and BRCA2 testing. Cervical Cancer Your health care provider may recommend that you be screened regularly for cancer of the pelvic organs (ovaries, uterus, and  vagina). This screening involves a pelvic examination, including checking for microscopic changes to the surface of your cervix (Pap test). You may be encouraged to have this screening done every 3 years, beginning at age 21.  For women ages 30-65, health care providers may recommend pelvic exams and Pap testing every 3 years, or they may recommend the Pap and pelvic exam, combined with testing for human papilloma virus (HPV), every 5 years. Some types of HPV increase your risk of cervical cancer. Testing for HPV may also be done on women of any age with unclear Pap test results.  Other health care providers may not recommend any screening for nonpregnant women who are considered low risk for pelvic cancer and who do not have symptoms. Ask your health care provider if a screening pelvic exam is right for you.  If you have had past treatment for cervical cancer or a condition that could lead to cancer, you need Pap tests and screening for cancer for at least 20 years after your treatment. If Pap tests have been discontinued, your risk factors (such as having a new sexual partner) need to be reassessed to determine if screening should resume. Some women have medical problems that increase the chance of getting cervical cancer. In these cases, your health care provider may recommend more frequent screening and Pap tests. Colorectal Cancer  This type of cancer can be detected and often prevented.  Routine colorectal cancer screening usually begins at 48 years of age and continues through 48 years of age.  Your health care provider may recommend screening at an earlier age if you have risk factors for colon cancer.  Your health care provider may also recommend using home test kits to check for hidden blood in the stool.  A small camera at the end of a tube can be used to examine your colon directly (sigmoidoscopy or colonoscopy). This is done to check for the earliest forms of colorectal  cancer.  Routine screening usually begins at age 50.  Direct examination of the colon should be repeated every 5-10 years through 48 years of age. However, you may need to be screened more often if early forms of precancerous polyps or small growths are found. Skin Cancer  Check your skin from head to toe regularly.  Tell your health care provider about any new moles or changes in moles, especially if there is a change in a mole's shape or color.  Also tell your health care provider if you have a mole that is larger than the size of a pencil eraser.  Always use sunscreen. Apply sunscreen liberally and repeatedly throughout the day.  Protect yourself by wearing long sleeves, pants, a wide-brimmed hat, and sunglasses whenever you are outside. HEART DISEASE, DIABETES, AND HIGH BLOOD PRESSURE   High blood pressure causes heart disease and increases the risk of stroke. High blood pressure is more likely to develop in:  People who have blood pressure in the high end   of the normal range (130-139/85-89 mm Hg).  People who are overweight or obese.  People who are African American.  If you are 38-23 years of age, have your blood pressure checked every 3-5 years. If you are 61 years of age or older, have your blood pressure checked every year. You should have your blood pressure measured twice--once when you are at a hospital or clinic, and once when you are not at a hospital or clinic. Record the average of the two measurements. To check your blood pressure when you are not at a hospital or clinic, you can use:  An automated blood pressure machine at a pharmacy.  A home blood pressure monitor.  If you are between 45 years and 39 years old, ask your health care provider if you should take aspirin to prevent strokes.  Have regular diabetes screenings. This involves taking a blood sample to check your fasting blood sugar level.  If you are at a normal weight and have a low risk for diabetes,  have this test once every three years after 48 years of age.  If you are overweight and have a high risk for diabetes, consider being tested at a younger age or more often. PREVENTING INFECTION  Hepatitis B  If you have a higher risk for hepatitis B, you should be screened for this virus. You are considered at high risk for hepatitis B if:  You were born in a country where hepatitis B is common. Ask your health care provider which countries are considered high risk.  Your parents were born in a high-risk country, and you have not been immunized against hepatitis B (hepatitis B vaccine).  You have HIV or AIDS.  You use needles to inject street drugs.  You live with someone who has hepatitis B.  You have had sex with someone who has hepatitis B.  You get hemodialysis treatment.  You take certain medicines for conditions, including cancer, organ transplantation, and autoimmune conditions. Hepatitis C  Blood testing is recommended for:  Everyone born from [birth year] through 1964.  Anyone with known risk factors for hepatitis C. Sexually transmitted infections (STIs)  You should be screened for sexually transmitted infections (STIs) including gonorrhea and chlamydia if:  You are sexually active and are younger than 48 years of age.  You are older than 48 years of age and your health care provider tells you that you are at risk for this type of infection.  Your sexual activity has changed since you were last screened and you are at an increased risk for chlamydia or gonorrhea. Ask your health care provider if you are at risk.  If you do not have HIV, but are at risk, it may be recommended that you take a prescription medicine daily to prevent HIV infection. This is called pre-exposure prophylaxis (PrEP). You are considered at risk if:  You are sexually active and do not regularly use condoms or know the HIV status of your partner(s).  You take drugs by injection.  You are sexually  active with a partner who has HIV. Talk with your health care provider about whether you are at high risk of being infected with HIV. If you choose to begin PrEP, you should first be tested for HIV. You should then be tested every 3 months for as long as you are taking PrEP.  PREGNANCY   If you are premenopausal and you may become pregnant, ask your health care provider about preconception counseling.  If you may  become pregnant, take 400 to 800 micrograms (mcg) of folic acid every day.  If you want to prevent pregnancy, talk to your health care provider about birth control (contraception). OSTEOPOROSIS AND MENOPAUSE   Osteoporosis is a disease in which the bones lose minerals and strength with aging. This can result in serious bone fractures. Your risk for osteoporosis can be identified using a bone density scan.  If you are 61 years of age or older, or if you are at risk for osteoporosis and fractures, ask your health care provider if you should be screened.  Ask your health care provider whether you should take a calcium or vitamin D supplement to lower your risk for osteoporosis.  Menopause may have certain physical symptoms and risks.  Hormone replacement therapy may reduce some of these symptoms and risks. Talk to your health care provider about whether hormone replacement therapy is right for you.  HOME CARE INSTRUCTIONS   Schedule regular health, dental, and eye exams.  Stay current with your immunizations.   Do not use any tobacco products including cigarettes, chewing tobacco, or electronic cigarettes.  If you are pregnant, do not drink alcohol.  If you are breastfeeding, limit how much and how often you drink alcohol.  Limit alcohol intake to no more than 1 drink per day for nonpregnant women. One drink equals 12 ounces of beer, 5 ounces of wine, or 1 ounces of hard liquor.  Do not use street drugs.  Do not share needles.  Ask your health care provider for help if  you need support or information about quitting drugs.  Tell your health care provider if you often feel depressed.  Tell your health care provider if you have ever been abused or do not feel safe at home.   This information is not intended to replace advice given to you by your health care provider. Make sure you discuss any questions you have with your health care provider.   Document Released: 08/31/2010 Document Revised: 03/08/2014 Document Reviewed: 01/17/2013 Elsevier Interactive Patient Education [employer].

## 2015-02-14 NOTE — Progress Notes (Signed)
Emma Greene [birth year] [id number]    History:    Presents for annual exam.  Continues to have irregular cycles every 1-6 months with increasing hot flushes. Last cycle September 2016. BTL. Has always had irregular cycles since starting cycle at age 49. Benign endometrial polyp 2010. In her 67s abnormal Pap with a cone biopsy normal Paps after. 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 Normal hemoglobin A1c July 2016. Reports labs at work  normal. Continues to struggle with her weight has had a 30 pound weight gain in the past 2 years. Same partner. Will mammogram history.   Past medical history, past surgical history, family history and social history were all reviewed and documented in the EPIC chart. Works at [employer]. Daughter 48, son 42 both doing well.  ROS:  A ROS was performed and pertinent positives and negatives are included.  Exam:  Filed Vitals:   02/14/15 0921  BP: 132/80    General appearance:  Normal Thyroid:  Symmetrical, normal in size, without palpable masses or nodularity. Respiratory  Auscultation:  Clear without wheezing or rhonchi Cardiovascular  Auscultation:  Regular rate, without rubs, murmurs or gallops  Edema/varicosities:  Not grossly evident Abdominal  Soft,nontender, without masses, guarding or rebound.  Liver/spleen:  No organomegaly noted  Hernia:  None appreciated  Skin  Inspection:  Grossly normal   Breasts: Examined lying and sitting.     Right: Without masses, retractions, discharge or axillary adenopathy.     Left: Without masses, retractions, discharge or axillary adenopathy. Gentitourinary   Inguinal/mons:  Normal without inguinal adenopathy  External genitalia:  Normal  BUS/Urethra/Skene's glands:  Normal  Vagina:  Normal  Cervix:  Normal  Uterus:  normal in size, shape and contour.  Midline and mobile limited exam-abdominal girth  Adnexa/parametria:     Rt: Without masses or tenderness.   Lt: Without masses or  tenderness.  Anus and perineum: Normal  Digital rectal exam: Normal sphincter tone without palpated masses or tenderness  Assessment/Plan:  49 y.o. D WF G2 P2 for annual exam.    Irregular cycles-always/BTL RSO, left partial ovary-dermoids Morbid obesity > 20 years ago abnormal Pap-cone biopsy normal Paps after  Plan: Reviewed importance of increasing regular exercise and decreasing calories for weight loss, Weight Watchers, bariatric surgery reviewed. Will schedule ultrasound to check endometrial thickness, limited ultrasound. [place]. UA, Pap with HR HPV typing new screening guidelines reviewed. Continue labs at work.SBE's, continue annual screening mammogram, calcium rich diet, vitamin D 1000 daily encouraged.  Huel Cote [facility], 10:53 AM 02/14/2015

## 2015-02-15 LAB — FOLLICLE STIMULATING HORMONE: FSH: 29.4 m[IU]/mL

## 2015-02-17 ENCOUNTER — Telehealth: Payer: Self-pay

## 2015-02-17 LAB — CYTOLOGY - PAP

## 2015-02-17 NOTE — Telephone Encounter (Signed)
Patient wants to know in light of result below did you still want her to have the u/s?

## 2015-02-17 NOTE — Telephone Encounter (Signed)
-----   Message from Huel Cote, NP sent at 02/17/2015 10:38 AM EST ----- Please call and review [place] is high/menopause range, may not have any more cycles.  Menopause symptoms are not problematic at this time have her call if problems or irreg bleeding. Review def of menopause is not having a cycle for 1 year.  Her last cycle was in Sept.

## 2015-02-17 NOTE — Telephone Encounter (Signed)
No we can hold off on [place] at this point but if any bleeding or spotting best to get [place]

## 2015-02-17 NOTE — Telephone Encounter (Signed)
Patient informed.  Appt cancelled.

## 2015-02-17 NOTE — Telephone Encounter (Signed)
-----   Message from Huel Cote, NP sent at 02/17/2015 10:38 AM EST ----- Please call and review [place] is high/menopause range, may not have any more cycles.  Menopause symptoms are not problematic at this time have her call if problems or irreg bleeding. Review def of menopause is not having a cycle for 1 year.  Her last cycle was in Sept.

## 2015-03-19 ENCOUNTER — Ambulatory Visit: Payer: BLUE CROSS/BLUE SHIELD | Admitting: Women's Health

## 2015-03-19 ENCOUNTER — Other Ambulatory Visit: Payer: BLUE CROSS/BLUE SHIELD

## 2015-04-04 ENCOUNTER — Ambulatory Visit: Payer: BLUE CROSS/BLUE SHIELD | Admitting: Internal Medicine

## 2015-07-14 ENCOUNTER — Telehealth: Payer: Self-pay | Admitting: *Deleted

## 2015-07-14 NOTE — Telephone Encounter (Signed)
Patient advised.

## 2015-07-14 NOTE — Telephone Encounter (Signed)
Not unusual to have escape ovulations as you're going through menopause. I would monitor for now and as long as it's a relatively normal period then will follow. If she has prolonged or atypical bleeding then follow up for evaluation.

## 2015-07-14 NOTE — Telephone Encounter (Signed)
(  You are back up MD) pt was told her Emma Greene in menopausal range, pt started bleeding on Friday, medium period flow, last cycle prior to this was in Sept. 2016. Pt was told to call if any bleeding should occur. Please advise

## 2015-07-14 NOTE — Telephone Encounter (Signed)
Left message for pt to call.

## 2015-12-03 ENCOUNTER — Encounter: Payer: Self-pay | Admitting: Internal Medicine

## 2015-12-03 ENCOUNTER — Ambulatory Visit (INDEPENDENT_AMBULATORY_CARE_PROVIDER_SITE_OTHER): Payer: BLUE CROSS/BLUE SHIELD | Admitting: Internal Medicine

## 2015-12-03 VITALS — BP 108/82 | HR 88 | Temp 98.7°F | Resp 12 | Ht 65.0 in | Wt 342.0 lb

## 2015-12-03 DIAGNOSIS — M791 Myalgia, unspecified site: Secondary | ICD-10-CM

## 2015-12-03 DIAGNOSIS — Z0000 Encounter for general adult medical examination without abnormal findings: Secondary | ICD-10-CM | POA: Diagnosis not present

## 2015-12-03 DIAGNOSIS — Z23 Encounter for immunization: Secondary | ICD-10-CM

## 2015-12-03 MED ORDER — MELOXICAM 7.5 MG PO TABS: 7.5000 mg | ORAL_TABLET | Freq: Every day | ORAL | 3 refills | Status: DC

## 2015-12-03 NOTE — Patient Instructions (Addendum)
We will not check labs today but send [place] the copy of the labs from your work screening.   If you are looking for a radical solution to the weight a book that I can recommend is called Bright Line Eating by Jarvis Morgan which helps to explain why losing weight is so hard and how your own body holds you back from losing weight.   Make an appointment with Dr. Tamala Julian for the ankles and back because we need to keep you moving to keep you healthy.   Health Maintenance, Female Adopting a healthy lifestyle and getting preventive care can go a long way to promote health and wellness. Talk with your health care provider about what schedule of regular examinations is right for you. This is a good chance for you to check in with your provider about disease prevention and staying healthy. In between checkups, there are plenty of things you can do on your own. Experts have done a lot of research about which lifestyle changes and preventive measures are most likely to keep you healthy. Ask your health care provider for more information. WEIGHT AND DIET  Eat a healthy diet  Be sure to include plenty of vegetables, fruits, low-fat dairy products, and lean protein.  Do not eat a lot of foods high in solid fats, added sugars, or salt.  Get regular exercise. This is one of the most important things you can do for your health.  Most adults should exercise for at least 150 minutes each week. The exercise should increase your heart rate and make you sweat (moderate-intensity exercise).  Most adults should also do strengthening exercises at least twice a week. This is in addition to the moderate-intensity exercise.  Maintain a healthy weight  Body mass index (BMI) is a measurement that can be used to identify possible weight problems. It estimates body fat based on height and weight. Your health care provider can help determine your BMI and help you achieve or maintain a healthy weight.  For females 38  years of age and older:   A BMI below 18.5 is considered underweight.  A BMI of 18.5 to 24.9 is normal.  A BMI of 25 to 29.9 is considered overweight.  A BMI of 30 and above is considered obese.  Watch levels of cholesterol and blood lipids  You should start having your blood tested for lipids and cholesterol at 49 years of age, then have this test every 5 years.  You may need to have your cholesterol levels checked more often if:  Your lipid or cholesterol levels are high.  You are older than 49 years of age.  You are at high risk for heart disease.  CANCER SCREENING   Lung Cancer  Lung cancer screening is recommended for adults 35-79 years old who are at high risk for lung cancer because of a history of smoking.  A yearly low-dose CT scan of the lungs is recommended for people who:  Currently smoke.  Have quit within the past 15 years.  Have at least a 30-pack-year history of smoking. A pack year is smoking an average of one pack of cigarettes a day for 1 year.  Yearly screening should continue until it has been 15 years since you quit.  Yearly screening should stop if you develop a health problem that would prevent you from having lung cancer treatment.  Breast Cancer  Practice breast self-awareness. This means understanding how your breasts normally appear and feel.  It also  means doing regular breast self-exams. Let your health care provider know about any changes, no matter how small.  If you are in your 20s or 30s, you should have a clinical breast exam (CBE) by a health care provider every 1-3 years as part of a regular health exam.  If you are 24 or older, have a CBE every year. Also consider having a breast X-ray (mammogram) every year.  If you have a family history of breast cancer, talk to your health care provider about genetic screening.  If you are at high risk for breast cancer, talk to your health care provider about having an MRI and a mammogram  every year.  Breast cancer gene (BRCA) assessment is recommended for women who have family members with BRCA-related cancers. BRCA-related cancers include:  Breast.  Ovarian.  Tubal.  Peritoneal cancers.  Results of the assessment will determine the need for genetic counseling and BRCA1 and BRCA2 testing. Cervical Cancer Your health care provider may recommend that you be screened regularly for cancer of the pelvic organs (ovaries, uterus, and vagina). This screening involves a pelvic examination, including checking for microscopic changes to the surface of your cervix (Pap test). You may be encouraged to have this screening done every 3 years, beginning at age 26.  For women ages 82-65, health care providers may recommend pelvic exams and Pap testing every 3 years, or they may recommend the Pap and pelvic exam, combined with testing for human papilloma virus (HPV), every 5 years. Some types of HPV increase your risk of cervical cancer. Testing for HPV may also be done on women of any age with unclear Pap test results.  Other health care providers may not recommend any screening for nonpregnant women who are considered low risk for pelvic cancer and who do not have symptoms. Ask your health care provider if a screening pelvic exam is right for you.  If you have had past treatment for cervical cancer or a condition that could lead to cancer, you need Pap tests and screening for cancer for at least 20 years after your treatment. If Pap tests have been discontinued, your risk factors (such as having a new sexual partner) need to be reassessed to determine if screening should resume. Some women have medical problems that increase the chance of getting cervical cancer. In these cases, your health care provider may recommend more frequent screening and Pap tests. Colorectal Cancer  This type of cancer can be detected and often prevented.  Routine colorectal cancer screening usually begins at 49  years of age and continues through 49 years of age.  Your health care provider may recommend screening at an earlier age if you have risk factors for colon cancer.  Your health care provider may also recommend using home test kits to check for hidden blood in the stool.  A small camera at the end of a tube can be used to examine your colon directly (sigmoidoscopy or colonoscopy). This is done to check for the earliest forms of colorectal cancer.  Routine screening usually begins at age 37.  Direct examination of the colon should be repeated every 5-10 years through 49 years of age. However, you may need to be screened more often if early forms of precancerous polyps or small growths are found. Skin Cancer  Check your skin from head to toe regularly.  Tell your health care provider about any new moles or changes in moles, especially if there is a change in a mole's shape  or color.  Also tell your health care provider if you have a mole that is larger than the size of a pencil eraser.  Always use sunscreen. Apply sunscreen liberally and repeatedly throughout the day.  Protect yourself by wearing long sleeves, pants, a wide-brimmed hat, and sunglasses whenever you are outside. HEART DISEASE, DIABETES, AND HIGH BLOOD PRESSURE   High blood pressure causes heart disease and increases the risk of stroke. High blood pressure is more likely to develop in:  People who have blood pressure in the high end of the normal range (130-139/85-89 mm Hg).  People who are overweight or obese.  People who are African American.  If you are 6-78 years of age, have your blood pressure checked every 3-5 years. If you are 37 years of age or older, have your blood pressure checked every year. You should have your blood pressure measured twice--once when you are at a hospital or clinic, and once when you are not at a hospital or clinic. Record the average of the two measurements. To check your blood pressure  when you are not at a hospital or clinic, you can use:  An automated blood pressure machine at a pharmacy.  A home blood pressure monitor.  If you are between 17 years and 37 years old, ask your health care provider if you should take aspirin to prevent strokes.  Have regular diabetes screenings. This involves taking a blood sample to check your fasting blood sugar level.  If you are at a normal weight and have a low risk for diabetes, have this test once every three years after 49 years of age.  If you are overweight and have a high risk for diabetes, consider being tested at a younger age or more often. PREVENTING INFECTION  Hepatitis B  If you have a higher risk for hepatitis B, you should be screened for this virus. You are considered at high risk for hepatitis B if:  You were born in a country where hepatitis B is common. Ask your health care provider which countries are considered high risk.  Your parents were born in a high-risk country, and you have not been immunized against hepatitis B (hepatitis B vaccine).  You have HIV or AIDS.  You use needles to inject street drugs.  You live with someone who has hepatitis B.  You have had sex with someone who has hepatitis B.  You get hemodialysis treatment.  You take certain medicines for conditions, including cancer, organ transplantation, and autoimmune conditions. Hepatitis C  Blood testing is recommended for:  Everyone born from [birth year] through 1964.  Anyone with known risk factors for hepatitis C. Sexually transmitted infections (STIs)  You should be screened for sexually transmitted infections (STIs) including gonorrhea and chlamydia if:  You are sexually active and are younger than 49 years of age.  You are older than 49 years of age and your health care provider tells you that you are at risk for this type of infection.  Your sexual activity has changed since you were last screened and you are at an increased risk  for chlamydia or gonorrhea. Ask your health care provider if you are at risk.  If you do not have HIV, but are at risk, it may be recommended that you take a prescription medicine daily to prevent HIV infection. This is called pre-exposure prophylaxis (PrEP). You are considered at risk if:  You are sexually active and do not regularly use condoms or know the HIV  status of your partner(s).  You take drugs by injection.  You are sexually active with a partner who has HIV. Talk with your health care provider about whether you are at high risk of being infected with HIV. If you choose to begin PrEP, you should first be tested for HIV. You should then be tested every 3 months for as long as you are taking PrEP.  PREGNANCY   If you are premenopausal and you may become pregnant, ask your health care provider about preconception counseling.  If you may become pregnant, take 400 to 800 micrograms (mcg) of folic acid every day.  If you want to prevent pregnancy, talk to your health care provider about birth control (contraception). OSTEOPOROSIS AND MENOPAUSE   Osteoporosis is a disease in which the bones lose minerals and strength with aging. This can result in serious bone fractures. Your risk for osteoporosis can be identified using a bone density scan.  If you are 6 years of age or older, or if you are at risk for osteoporosis and fractures, ask your health care provider if you should be screened.  Ask your health care provider whether you should take a calcium or vitamin D supplement to lower your risk for osteoporosis.  Menopause may have certain physical symptoms and risks.  Hormone replacement therapy may reduce some of these symptoms and risks. Talk to your health care provider about whether hormone replacement therapy is right for you.  HOME CARE INSTRUCTIONS   Schedule regular health, dental, and eye exams.  Stay current with your immunizations.   Do not use any tobacco products  including cigarettes, chewing tobacco, or electronic cigarettes.  If you are pregnant, do not drink alcohol.  If you are breastfeeding, limit how much and how often you drink alcohol.  Limit alcohol intake to no more than 1 drink per day for nonpregnant women. One drink equals 12 ounces of beer, 5 ounces of wine, or 1 ounces of hard liquor.  Do not use street drugs.  Do not share needles.  Ask your health care provider for help if you need support or information about quitting drugs.  Tell your health care provider if you often feel depressed.  Tell your health care provider if you have ever been abused or do not feel safe at home.   This information is not intended to replace advice given to you by your health care provider. Make sure you discuss any questions you have with your health care provider.   Document Released: 08/31/2010 Document Revised: 03/08/2014 Document Reviewed: 01/17/2013 Elsevier Interactive Patient Education [employer].

## 2015-12-03 NOTE — Assessment & Plan Note (Signed)
Encouraged to schedule with Dr. Tamala Julian for her ankles. Rx for meloxicam to take as needed for pain.

## 2015-12-03 NOTE — Addendum Note (Signed)
Addended by: Resa Miner R on: 12/03/2015 02:09 PM   Modules accepted: Orders

## 2015-12-03 NOTE — Progress Notes (Signed)
Pre visit review using our clinic review tool, if applicable. No additional management support is needed unless otherwise documented below in the visit note. 

## 2015-12-03 NOTE — Assessment & Plan Note (Signed)
Weight is up about 25 pounds since last year mostly due to poor diet and less activity. Her weight is limiting her QOL and she is starting to have GERD, OA, myalgias. She knows that this is a serious problem and she was given advice on diet recommendations at visit.

## 2015-12-03 NOTE — Assessment & Plan Note (Addendum)
She is getting labs with her work in the next month or so and does not need labs today. CV risk is low. Talked about her weight which is limiting her QOL and activity. She is wanting to lose weight but feels helpless. Talked to her about sun safety and mole surveillance. Given screening recommendations. Declines flu shot and given tdap today.

## 2015-12-03 NOTE — Progress Notes (Signed)
   Subjective:    Patient ID: Emma Greene, female    DOB: [birth year], 50 y.o.   MRN: [id number]  HPI The patient is a 50 YO female coming in for wellness. Concerned about her weight.  PMH, [facility], social history reviewed and updated.   Review of Systems  Constitutional: Positive for activity change and fatigue. Negative for appetite change, fever and unexpected weight change.  HENT: Negative.   Eyes: Negative.   Respiratory: Negative for cough, chest tightness and shortness of breath.   Cardiovascular: Negative for chest pain, palpitations and leg swelling.  Gastrointestinal: Negative for abdominal distention, abdominal pain, constipation, diarrhea, nausea and vomiting.  Musculoskeletal: Positive for arthralgias, back pain and myalgias. Negative for gait problem, joint swelling, neck pain and neck stiffness.  Skin: Negative.   Neurological: Negative.   Psychiatric/Behavioral: Negative.       Objective:   Physical Exam  Constitutional: She is oriented to person, place, and time. She appears well-developed and well-nourished.  Overweight  HENT:  Head: Normocephalic and atraumatic.  Eyes: EOM are normal.  Neck: Normal range of motion.  Cardiovascular: Normal rate and regular rhythm.   Pulmonary/Chest: Effort normal and breath sounds normal. No respiratory distress. She has no wheezes. She has no rales.  Abdominal: Soft. Bowel sounds are normal. She exhibits no distension. There is no tenderness. There is no rebound.  Musculoskeletal:  Multiple tender points. Pain in the ankles along the achilles tendon.   Neurological: She is alert and oriented to person, place, and time. Coordination normal.  Skin: Skin is warm and dry.  Psychiatric: She has a normal mood and affect.   Vitals:   12/03/15 0832  BP: 108/82  Pulse: 88  Resp: 12  Temp: 98.7 F (37.1 C)  TempSrc: Oral  SpO2: 97%  Weight: (!) 342 lb (155.1 kg)  Height: 5\' 5"  (1.651 m)      Assessment & Plan:  Tdap given at  visit.

## 2015-12-15 NOTE — Progress Notes (Signed)
[facility] [address] [place], [place] [address] Phone: [phone] Subjective:    I'm seeing this patient by the request  of:  Hoyt Koch, MD   CC: Back pain and ankle pain  [id number]  Emma Greene is a 50 y.o. female coming in with complaint of back pain and ankle pain. This is new Problem for patient. States that the back pain is significant worse than what she has had over the course last year.States that it seems to be worse when her ankle is hurting. Has had a dull throbbing aching pain for some time. Denies any radiation down the legs but states that she does have ankle problem.  Also complaining of ankle pain. Patient states that the pain seems to be on the posterior aspect of the ankle. States that it seems to be bilateral. Worsening over the course of time. After sitting for long amount of time significant tightness for minutes. States that it is all on the posterior aspect. Denies any radiation of the calf. Has noticed some more swelling of the legs. Patient has tried changing shoes or being barefoot with minimal improvement. Patient denies any true injury. Rates the severity of pain a 6 out of 10. Denies nighttime awakening.      Past Medical History:  Diagnosis Date  . Allergy   . Anemia   . Arthritis   . Heart murmur    Past Surgical History:  Procedure Laterality Date  . CHOLECYSTECTOMY    . HYSTEROSCOPY     with D&C  . OOPHORECTOMY    . right salpingo-oophorrectomy    . TUBAL LIGATION     Social History   Social History  . Marital status: Legally Separated    Spouse name: N/A  . Number of children: N/A  . Years of education: N/A   Social History Main Topics  . Smoking status: Never Smoker  . Smokeless tobacco: Never Used  . Alcohol use No  . Drug use: No  . Sexual activity: Yes   Other Topics Concern  . Not on file   Social History Narrative  . No narrative on file   No Known Allergies Family  History  Problem Relation Age of Onset  . Heart disease Father   . Hyperlipidemia Mother   . Hypertension Mother   . Cancer Maternal Grandfather     lung- smoker  . Hypertension Brother   . Heart disease Maternal Grandmother   . Hyperlipidemia Maternal Grandmother   . Hypertension Maternal Grandmother   . Heart disease Paternal Grandmother   . Heart disease Paternal Grandfather     Past medical history, social, surgical and family history all reviewed in electronic medical record.  No pertanent information unless stated regarding to the chief complaint.   Review of Systems: No headache, visual changes, nausea, vomiting, diarrhea, constipation, dizziness, abdominal pain, skin rash, fevers, chills, night sweats, weight loss, swollen lymph nodes, body aches, joint swelling, muscle aches, chest pain, shortness of breath, mood changes.   Objective  There were no vitals taken for this visit.  General: No apparent distress alert and oriented x3 mood and affect normal, dressed appropriately. Morbidly obese HEENT: Pupils equal, extraocular movements intact  Respiratory: Patient's speak in full sentences and does not appear short of breath  Cardiovascular: +1 lower extremity edema, non tender, no erythema  Skin: Warm dry intact with no signs of infection or rash on extremities or on axial skeleton.  Abdomen: Soft  nontender  Neuro: Cranial nerves II through XII are intact, neurovascularly intact in all extremities with 2+ DTRs and 2+ pulses.  Lymph: No lymphadenopathy of posterior or anterior cervical chain or axillae bilaterally.  Gait mild antalgic.  MSK:  Non tender with full range of motion and good stability and symmetric strength and tone of shoulders, elbows, wrist, hip, knees bilaterally.  Foot exam shows pes planus bilaterally. Over pronation of the hindfoot. Postsurgical changes of the hallux bilaterally. Back Exam:  Inspection: Unremarkable  Motion: Flexion 30 deg, Extension 15 deg,  Side Bending to 5 deg bilaterally,  Rotation to 45 deg bilaterally  SLR laying: Negative  XSLR laying: Negative  Palpable tenderness: Tender to palpation of the paraspinal musculature. FABER: negative. Sensory change: Gross sensation intact to all lumbar and sacral dermatomes.  Reflexes: 2+ at both patellar tendons, 2+ at achilles tendons, Babinski's downgoing.  Strength at foot  Plantar-flexion: 5/5 Dorsi-flexion: 5/5 Eversion: 5/5 Inversion: 5/5  Leg strength  4 out of 5 but symmetric  Ankle: Bilateral +1 pain edema bilaterally. Haglund's nodule noted bilaterally Range of motion is full in all directions. Strength is 5/5 in all directions. Stable lateral and medial ligaments; squeeze test and kleiger test unremarkable; Talar dome nontender; No pain at base of 5th MT; No tenderness over cuboid; No tenderness over N spot or navicular prominence No tenderness on posterior aspects of lateral and medial malleolus No sign of peroneal tendon subluxations or tenderness to palpation Negative tarsal tunnel tinel's Physical tenderness to palpation at the insertion of the Achilles bilaterally negative Thompson test Able to walk 4 steps.  MSK US performed of: Bilateral This study was ordered, performed, and interpreted by Charlann Boxer D.O.  Foot/Ankle:   Achilles tendon visualized along length of tendon. Less than does have calcific changes as well as intersubstance tearing with increasing Doppler flow and hypoechoic changes. Right side has hypoechoic changes but significant decrease in Doppler flow no true intersubstance tearing noted. IMPRESSION:  Chronic Achilles tendinosis bilaterally  Procedure note [id number]; 15 minutes spent for Therapeutic exercises as stated in above notes.  This included exercises focusing on stretching, strengthening, with significant focus on eccentric aspects. Ankle strengthening that included:  Basic range of motion exercises to allow proper full motion at  ankle Stretching of the lower leg and hamstrings  Theraband exercises for the lower leg - inversion, eversion, dorsiflexion and plantarflexion each to be completed with a theraband Balance exercises to increase proprioception Weight bearing exercises to increase strength and balance    Proper technique shown and discussed handout in great detail with ATC.  All questions were discussed and answered.      Impression and Recommendations:     This case required medical decision making of moderate complexity.      Note: This dictation was prepared with Dragon dictation along with smaller phrase technology. Any transcriptional errors that result from this process are unintentional.

## 2015-12-16 ENCOUNTER — Ambulatory Visit (INDEPENDENT_AMBULATORY_CARE_PROVIDER_SITE_OTHER): Payer: BLUE CROSS/BLUE SHIELD | Admitting: Family Medicine

## 2015-12-16 ENCOUNTER — Encounter: Payer: Self-pay | Admitting: Family Medicine

## 2015-12-16 ENCOUNTER — Ambulatory Visit: Payer: BLUE CROSS/BLUE SHIELD

## 2015-12-16 VITALS — BP 128/84 | HR 72 | Wt 341.0 lb

## 2015-12-16 DIAGNOSIS — M7661 Achilles tendinitis, right leg: Secondary | ICD-10-CM

## 2015-12-16 DIAGNOSIS — M6788 Other specified disorders of synovium and tendon, other site: Secondary | ICD-10-CM | POA: Insufficient documentation

## 2015-12-16 DIAGNOSIS — M549 Dorsalgia, unspecified: Secondary | ICD-10-CM

## 2015-12-16 DIAGNOSIS — M25571 Pain in right ankle and joints of right foot: Secondary | ICD-10-CM | POA: Diagnosis not present

## 2015-12-16 DIAGNOSIS — M2142 Flat foot [pes planus] (acquired), left foot: Secondary | ICD-10-CM

## 2015-12-16 DIAGNOSIS — G8929 Other chronic pain: Secondary | ICD-10-CM

## 2015-12-16 DIAGNOSIS — M2141 Flat foot [pes planus] (acquired), right foot: Secondary | ICD-10-CM

## 2015-12-16 DIAGNOSIS — M2140 Flat foot [pes planus] (acquired), unspecified foot: Secondary | ICD-10-CM | POA: Insufficient documentation

## 2015-12-16 DIAGNOSIS — M7662 Achilles tendinitis, left leg: Secondary | ICD-10-CM

## 2015-12-16 MED ORDER — DICLOFENAC SODIUM 2 % TD SOLN: 2.0000 "application " | Freq: Two times a day (BID) | TRANSDERMAL | 3 refills | Status: DC

## 2015-12-16 MED ORDER — MELOXICAM 15 MG PO TABS: 15.0000 mg | ORAL_TABLET | Freq: Every day | ORAL | 3 refills | Status: DC

## 2015-12-16 MED ORDER — VITAMIN D (ERGOCALCIFEROL) 1.25 MG (50000 UNIT) PO CAPS: 50000.0000 [IU] | ORAL_CAPSULE | ORAL | 0 refills | Status: DC

## 2015-12-16 NOTE — Assessment & Plan Note (Signed)
Encourage weight loss. 

## 2015-12-16 NOTE — Assessment & Plan Note (Signed)
Discussed heel lifts, icing protocol, home exercises. Discussed proper shoes. Patient work with Product/process development scientist and given topical anti-inflammatories. Follow-up again in 4 weeks. Continuing pain consider nitroglycerin or formal physical therapy.

## 2015-12-16 NOTE — Patient Instructions (Addendum)
Good to see you  Ice 20 minutes 2 times daily. Usually after activity and before bed. Exercises 3 times a week.  Heel lift in your shoe at all times Avoid being barefoot.  Good shoes with rigid bottom.  Jalene Mullet, Merrell or New balance greater then 700 [employer] orthotics "total support" online would be great  pennsaid pinkie amount topically 2 times daily as needed.  Avoid jumping or running for next 2 weeks.  Once weekly vitamin D for next 12 weeks.  See me again in 4 weeks.

## 2015-12-16 NOTE — Assessment & Plan Note (Signed)
Secondary to patient's body habitus and poor core strength. Patient also pes planus is likely contribute in. We discussed icing regimen and home exercises. Discussed which activities to do a which ones to avoid. Patient given some mild range of motion exercises. Follow-up again in 4-6 weeks.

## 2016-01-13 ENCOUNTER — Ambulatory Visit: Payer: BLUE CROSS/BLUE SHIELD | Admitting: Family Medicine

## 2016-01-29 ENCOUNTER — Ambulatory Visit: Payer: BLUE CROSS/BLUE SHIELD | Admitting: Family Medicine

## 2016-02-17 ENCOUNTER — Encounter: Payer: BLUE CROSS/BLUE SHIELD | Admitting: Women's Health

## 2016-03-08 ENCOUNTER — Ambulatory Visit: Payer: BLUE CROSS/BLUE SHIELD | Admitting: Family Medicine

## 2017-02-08 ENCOUNTER — Ambulatory Visit: Payer: BLUE CROSS/BLUE SHIELD | Admitting: Women's Health

## 2017-02-09 ENCOUNTER — Ambulatory Visit: Payer: BLUE CROSS/BLUE SHIELD | Admitting: Women's Health

## 2017-02-09 ENCOUNTER — Encounter: Payer: Self-pay | Admitting: Women's Health

## 2017-02-09 ENCOUNTER — Other Ambulatory Visit: Payer: Self-pay | Admitting: Women's Health

## 2017-02-09 ENCOUNTER — Ambulatory Visit (INDEPENDENT_AMBULATORY_CARE_PROVIDER_SITE_OTHER): Payer: BLUE CROSS/BLUE SHIELD

## 2017-02-09 VITALS — BP 140/80 | Ht 64.0 in | Wt 321.0 lb

## 2017-02-09 DIAGNOSIS — N83202 Unspecified ovarian cyst, left side: Secondary | ICD-10-CM

## 2017-02-09 DIAGNOSIS — N9489 Other specified conditions associated with female genital organs and menstrual cycle: Secondary | ICD-10-CM

## 2017-02-09 DIAGNOSIS — R1900 Intra-abdominal and pelvic swelling, mass and lump, unspecified site: Secondary | ICD-10-CM | POA: Diagnosis not present

## 2017-02-09 DIAGNOSIS — N83201 Unspecified ovarian cyst, right side: Secondary | ICD-10-CM | POA: Diagnosis not present

## 2017-02-09 DIAGNOSIS — N949 Unspecified condition associated with female genital organs and menstrual cycle: Secondary | ICD-10-CM | POA: Diagnosis not present

## 2017-02-09 NOTE — Patient Instructions (Addendum)
CA-125 Tumor Marker Test Why am I having this test? This test is used to check the level of cancer antigen 125 (CA-125) in your blood. The CA-125 tumor marker test can be helpful in detecting ovarian cancer. The test is only performed if you are considered at high risk for ovarian cancer. Your health care provider may recommend this test if:  You have a strong family history of ovarian cancer.  You have a breast cancer antigen (BRCA) genetic defect.  If you have already been diagnosed with ovarian cancer, your health care provider may use this test to help identify the extent of the disease and to monitor your response to treatment. What kind of sample is taken? A blood sample is required for this test. It is usually collected by inserting a needle into a vein. How do I prepare for this test? There is no preparation required for this test. What are the reference ranges? Reference ranges are considered healthy ranges established after testing a large group of healthy people. Reference ranges may vary among different people, labs, and hospitals. It is your responsibility to obtain your test results. Ask the lab or department performing the test when and how you will get your results. The reference range for this test is 0-35 units/mL or less than 35 kunits/L (SI units). What do the results mean? Increased levels of CA-125 may indicate:  Certain types of cancer, including: ? Ovarian cancer. ? Pancreatic cancer. ? Colon cancer. ? Lung cancer. ? Breast cancer. ? Lymphoma.  Noncancerous (benign) disorders, including: ? Cirrhosis. ? Pregnancy. ? Endometriosis. ? Pancreatitis. ? Pelvic inflammatory disease (PID).  Talk with your health care provider to discuss your results, treatment options, and if necessary, the need for more tests. Talk with your health care provider if you have any questions about your results. Talk with your health care provider to discuss your results, treatment  options, and if necessary, the need for more tests. Talk with your health care provider if you have any questions about your results. This information is not intended to replace advice given to you by your health care provider. Make sure you discuss any questions you have with your health care provider. Document Released: 03/09/2004 Document Revised: 10/21/2015 Document Reviewed: 07/05/2013 Elsevier Interactive Patient Education  2018 [employer].  Ovarian Cyst An ovarian cyst is a fluid-filled sac on an ovary. The ovaries are organs that make eggs in women. Most ovarian cysts go away on their own and are not cancerous (are benign). Some cysts need treatment. Follow these instructions at home:  Take over-the-counter and prescription medicines only as told by your doctor.  Do not drive or use heavy machinery while taking prescription pain medicine.  Get pelvic exams and Pap tests as often as told by your doctor.  Return to your normal activities as told by your doctor. Ask your doctor what activities are safe for you.  Do not use any products that contain nicotine or tobacco, such as cigarettes and e-cigarettes. If you need help quitting, ask your doctor.  Keep all follow-up visits as told by your doctor. This is important. Contact a doctor if:  Your periods are: ? Late. ? Irregular. ? Painful.  Your periods stop.  You have pelvic pain that does not go away.  You have pressure on your bladder.  You have trouble making your bladder empty when you pee (urinate).  You have pain during sex.  You have any of the following in your belly (abdomen): ?  A feeling of fullness. ? Pressure. ? Discomfort. ? Pain that does not go away. ? Swelling.  You feel sick most of the time.  You have trouble pooping (have constipation).  You are not as hungry as usual (you lose your appetite).  You get very bad acne.  You start to have more hair on your body and face.  You are gaining  weight or losing weight without changing your exercise and eating habits.  You think you may be pregnant. Get help right away if:  You have belly pain that is very bad or gets worse.  You cannot eat or drink without throwing up (vomiting).  You suddenly get a fever.  Your period is a lot heavier than usual. This information is not intended to replace advice given to you by your health care provider. Make sure you discuss any questions you have with your health care provider. Document Released: 08/04/2007 Document Revised: 09/05/2015 Document Reviewed: 07/20/2015 Elsevier Interactive Patient Education  2017 [employer].

## 2017-02-09 NOTE — Progress Notes (Signed)
51 year old DW F G3 P2 presents for follow-up for ovarian masses noted on CT scan and questionable fatty liver/mass. 11/28/2016 had severe abdominal pain was seen at the hospital in [place] where she now resides and was told she had 2 ovarian cysts on CT scan. Had follow-up CT 01/24/2017 that noted multiple small masses in the peritoneum largest 2.3 x 1.7 cm concerning for possible metastatic disease. Follow-up CT scan of abdomen and pelvis with IV contrast on 01/31/2017 showed bilateral ovarian cyst 2.5 cm on the right and 4.15.3 cm on the left. Similar findings on 11/28/2016 without change. At this point was told they would repeat ultrasound in one year, presents today to discuss and questions if she should have a hysterectomy. Postmenopausal 1 year, irregular cycles for several years prior. 2010 benign endometrial polyp removed  per Dr. Phineas Real and cycles became much lighter after. History of a BTL with [facility] for dermoid. Reports normal labs and denies health problems.Recently diagnosed with hypertension and  is on lisinopril tolerating well.  Morbid obesity weight today 321. Not sexually active, denies need for STD screen. 02/07/2017 had cardiac imaging- normal. Last Pap 2016 normal with negative HR HPV. Had annual exam with primary care in [place]. Overdue for mammogram, normal mammogram history.  Exam: Appears worried, tearful most of visit due to worry, denies pain. Ultrasound: T/V & T/A anteverted uterus with prominent endometrium  7.5. Nabothian cysts on cervix. Right ovary not seen, right adnexal echo-free cystic mass negative CFD 40 x 37 mm. Left ovary not seen, 2 cystic mass #1 39 x 43 x 40 mm, #2 38 x 27 x 31 mm, negative CFD, tubular shaped cystic mass 48 x 32 mm, negative CFD. Negative cul-de-sac. Limited image of liver and right kidney.  Right and left ovarian masses postmenopausal Morbid obesity Hypertension-primary care manages  Plan: CA 125, if low repeat ultrasound in 2-3 months to  check stability. Reviewed surgical risks and best to not jump to surgery if not needed. Instructed  to follow-up with GI for screening colonoscopy and questionable fatty liver assessment/ultrasound. Reviewed importance of increasing exercise, daily walking encouraged, decreasing calories/carbs for weight loss encouraged.

## 2017-02-10 LAB — CA 125: CA 125: 10 U/mL (ref <35)

## 2017-02-17 ENCOUNTER — Telehealth: Payer: Self-pay | Admitting: Women's Health

## 2017-02-17 NOTE — Telephone Encounter (Signed)
-----   Message from Anastasio Auerbach, MD sent at 02/16/2017  9:16 AM EST ----- Emma Greene,  I would recommend a GYN oncology consult with Everitt Amber.  Reference bilateral ovarian cystic masses, normal CA 125.  This way if she would recommend ongoing expectant management this would cover [place].  If she would recommend surgical intervention then she could address this robotically

## 2017-02-17 NOTE — Telephone Encounter (Signed)
Discussed with Emma Greene referral to Dr Denman George gyn oncologist for eval of postmenopause ovarian cysts with low ca 125. Would like to proceed with referral

## 2017-02-23 ENCOUNTER — Telehealth: Payer: Self-pay | Admitting: *Deleted

## 2017-02-23 NOTE — Telephone Encounter (Signed)
Anderson Malta from [facility] called for a new patient appt. Appt given and she will call the patient

## 2017-02-23 NOTE — Telephone Encounter (Signed)
Patient schedule on 03/04/17 @ 11:45am with Dr. Fermin Schwab pt aware.

## 2017-02-23 NOTE — Telephone Encounter (Signed)
-----   Message from Huel Cote, NP sent at 02/17/2017  8:40 AM EST ----- Please schedule referral to Dr Denman George to evaluate post menopausal bilateral ovarian cysts with low ca 125. Attach OV note and [place]. She can make appt anytime, has a home here but lives in [place]. Thanks I have talked with her about referral

## 2017-03-04 ENCOUNTER — Encounter: Payer: Self-pay | Admitting: Gynecology

## 2017-03-04 ENCOUNTER — Ambulatory Visit: Payer: BLUE CROSS/BLUE SHIELD | Attending: Gynecology | Admitting: Gynecology

## 2017-03-04 VITALS — BP 153/65 | HR 70 | Temp 98.4°F | Resp 20 | Ht 64.0 in | Wt 322.0 lb

## 2017-03-04 DIAGNOSIS — Z9851 Tubal ligation status: Secondary | ICD-10-CM | POA: Diagnosis not present

## 2017-03-04 DIAGNOSIS — Z87891 Personal history of nicotine dependence: Secondary | ICD-10-CM | POA: Diagnosis not present

## 2017-03-04 DIAGNOSIS — N839 Noninflammatory disorder of ovary, fallopian tube and broad ligament, unspecified: Secondary | ICD-10-CM | POA: Diagnosis not present

## 2017-03-04 DIAGNOSIS — Z9049 Acquired absence of other specified parts of digestive tract: Secondary | ICD-10-CM | POA: Insufficient documentation

## 2017-03-04 DIAGNOSIS — N838 Other noninflammatory disorders of ovary, fallopian tube and broad ligament: Secondary | ICD-10-CM

## 2017-03-04 DIAGNOSIS — Z8249 Family history of ischemic heart disease and other diseases of the circulatory system: Secondary | ICD-10-CM | POA: Insufficient documentation

## 2017-03-04 DIAGNOSIS — Z801 Family history of malignant neoplasm of trachea, bronchus and lung: Secondary | ICD-10-CM | POA: Diagnosis not present

## 2017-03-04 NOTE — Patient Instructions (Signed)
Plan on having an ultrasound in two to three months.  Please have the CT images sent to our office.  Please call for any questions or concerns.

## 2017-03-04 NOTE — Progress Notes (Signed)
Consult Note: Gyn-Onc   Emma Greene 52 y.o. female  Chief Complaint  Patient presents with  . Ovarian mass, left  . Ovarian mass, right    Assessment : Pelvic cysts that appear to be benign in nature.  CT scan suggesting peritoneal nodules.  Second opinion regarding the CT scan will be requested..  Plan: I indicated to the patient and her husband that the cysts in the pelvis themselves appear to be benign and given that they are asymptomatic it is reasonable to follow them with a repeat ultrasound in 3 months.  However, with the unclear history as to "peritoneal nodules" found on CT scan I would like to have our radiologist offer second opinion as to the nature of these alleged nodules.  The patient will have a CT scan sent to [place] for secondary interpretation.  I indicated that if there is something worrisome on CT scan findings we may need to reconsider surgery.  HPI: 52 year old menopausal woman seen in consultation request of Dr. Abbie Sons regarding management of pelvic cysts.  In September 2018 the patient presented to urgent care and to the emergency room with acute abdominal pain which apparently resolved spontaneously.  However in the course of the workup to ovarian cysts were detected on CT scan.  A follow-up CT scan on January 24, 2017 noted "multiple small masses in the peritoneum the largest measuring 2.3 x 1.7 cm concerning for possible metastatic disease" (that CT scan is not available for review at the present time.  Patient had yet another CT scan on January 31, 2017 showing bilateral ovarian cyst measuring 2.5 on the right and 4.1 x 5.3 on the left these are similar to the cyst identified in September and there is no change.  The patient recently had a ultrasound of the pelvis showing a echo-free cystic mass on the right adnexal region measuring 4 x 3.7 cm.  On the left are 2 cystic masses measuring 3.9 x 4.3 x 4.0 cm and a second measuring 3.8 x 2.7 x 3.1 cm.  In addition  there is a tubular shaped cystic mass measuring 4.8 x 3.2 cm.  Ca1 25 is 10 units/mL.  The patient indicates that she does not feel any pelvic pain or pressure.  Review of Systems:10 point review of systems is negative except as noted in interval history.   Vitals: Blood pressure (!) 153/65, pulse 70, temperature 98.4 F (36.9 C), temperature source Oral, resp. rate 20, height 5\' 4"  (1.626 m), weight (!) 322 lb (146.1 kg), SpO2 97 %.  Physical Exam: General : The patient is a healthy woman in no acute distress.  HEENT: normocephalic, extraoccular movements normal; neck is supple without thyromegally  Lynphnodes: Supraclavicular and inguinal nodes not enlarged  Abdomen: Obese, soft, non-tender, no ascites, no organomegally, no masses, no hernias  Pelvic:  EGBUS: Normal female  Vagina: Normal, no lesions  Urethra and Bladder: Normal, non-tender  Cervix: Normal Uterus: Difficult to evaluate given the patient's body habitus. Bi-manual examination: Non-tender; no adenxal masses or nodularity  Rectal: normal sphincter tone, no masses, no blood  Lower extremities: No edema or varicosities. Normal range of motion      No Known Allergies  Past Medical History:  Diagnosis Date  . Allergy   . Anemia   . Arthritis   . Heart murmur     Past Surgical History:  Procedure Laterality Date  . CHOLECYSTECTOMY    . HYSTEROSCOPY     with D&C  . OOPHORECTOMY    .  right salpingo-oophorrectomy    . TUBAL LIGATION      Current Outpatient Medications  Medication Sig Dispense Refill  . lisinopril-hydrochlorothiazide (PRINZIDE,ZESTORETIC) 10-12.5 MG tablet Take 1 tablet by mouth daily.     No current facility-administered medications for this visit.     Social History   Socioeconomic History  . Marital status: Divorced    Spouse name: Not on file  . Number of children: Not on file  . Years of education: Not on file  . Highest education level: Not on file  Social Needs  . Financial  resource strain: Not on file  . Food insecurity - worry: Not on file  . Food insecurity - inability: Not on file  . Transportation needs - medical: Not on file  . Transportation needs - non-medical: Not on file  Occupational History  . Not on file  Tobacco Use  . Smoking status: Former Smoker    Last attempt to quit: 02/10/2015    Years since quitting: 2.0  . Smokeless tobacco: Never Used  Substance and Sexual Activity  . Alcohol use: No    Alcohol/week: 0.0 oz  . Drug use: No  . Sexual activity: Yes  Other Topics Concern  . Not on file  Social History Narrative  . Not on file    Family History  Problem Relation Age of Onset  . Heart disease Father   . Hyperlipidemia Mother   . Hypertension Mother   . Cancer Maternal Grandfather        lung- smoker  . Hypertension Brother   . Heart disease Maternal Grandmother   . Hyperlipidemia Maternal Grandmother   . Hypertension Maternal Grandmother   . Heart disease Paternal Grandmother   . Heart disease Paternal Grandfather       Marti Sleigh, MD 03/04/2017, 1:56 PM

## 2017-03-14 ENCOUNTER — Telehealth: Payer: Self-pay | Admitting: Women's Health

## 2017-03-14 NOTE — Telephone Encounter (Signed)
TC will get records sent for review. Will wait for records review to schedule 3 month follow-up. Questions answered.

## 2017-03-14 NOTE — Telephone Encounter (Signed)
-----   Message from Anastasio Auerbach, MD sent at 03/07/2017  3:29 PM EST -----   ----- Message ----- From: Marti Sleigh, MD Sent: 03/04/2017   2:03 PM To: Anastasio Auerbach, MD, #

## 2017-03-17 ENCOUNTER — Telehealth: Payer: Self-pay | Admitting: *Deleted

## 2017-03-17 NOTE — Telephone Encounter (Signed)
Faxed the release the form for authorization of information to [facility] for the CT scans.

## 2017-03-28 ENCOUNTER — Other Ambulatory Visit: Payer: Self-pay | Admitting: Gynecologic Oncology

## 2017-03-28 ENCOUNTER — Ambulatory Visit
Admission: RE | Admit: 2017-03-28 | Discharge: 2017-03-28 | Disposition: A | Discharge status: Home / Self Care | Payer: Self-pay | Source: Ambulatory Visit | Attending: Gynecologic Oncology | Admitting: Gynecologic Oncology

## 2017-03-28 DIAGNOSIS — N83202 Unspecified ovarian cyst, left side: Principal | ICD-10-CM

## 2017-03-28 DIAGNOSIS — N83201 Unspecified ovarian cyst, right side: Secondary | ICD-10-CM

## 2017-03-28 DIAGNOSIS — N838 Other noninflammatory disorders of ovary, fallopian tube and broad ligament: Secondary | ICD-10-CM

## 2017-04-04 ENCOUNTER — Telehealth: Payer: Self-pay | Admitting: Gynecologic Oncology

## 2017-04-04 DIAGNOSIS — R1907 Generalized intra-abdominal and pelvic swelling, mass and lump: Secondary | ICD-10-CM

## 2017-04-04 NOTE — Telephone Encounter (Signed)
Spoke with patient about Dr. Mora Bellman recommendations for follow up CT 3 mths from previous to re-evaluate peritoneal masses seen on previous scan in [place].  All questions answered.  Original CT from [facility] reviewed by Dr. Polly Cobia in Radiology.  Patient will pick up oral contrast. Advised she would be contacted with CT scan appt.

## 2017-04-26 ENCOUNTER — Encounter (HOSPITAL_COMMUNITY): Payer: Self-pay

## 2017-04-26 ENCOUNTER — Ambulatory Visit (HOSPITAL_COMMUNITY)
Admission: RE | Admit: 2017-04-26 | Discharge: 2017-04-26 | Disposition: A | Discharge status: Home / Self Care | Payer: BLUE CROSS/BLUE SHIELD | Source: Ambulatory Visit | Attending: Gynecologic Oncology | Admitting: Gynecologic Oncology

## 2017-04-26 DIAGNOSIS — N838 Other noninflammatory disorders of ovary, fallopian tube and broad ligament: Secondary | ICD-10-CM | POA: Insufficient documentation

## 2017-04-26 DIAGNOSIS — I700 Atherosclerosis of aorta: Secondary | ICD-10-CM | POA: Insufficient documentation

## 2017-04-26 DIAGNOSIS — N200 Calculus of kidney: Secondary | ICD-10-CM | POA: Diagnosis not present

## 2017-04-26 DIAGNOSIS — R1907 Generalized intra-abdominal and pelvic swelling, mass and lump: Secondary | ICD-10-CM | POA: Insufficient documentation

## 2017-04-26 DIAGNOSIS — K760 Fatty (change of) liver, not elsewhere classified: Secondary | ICD-10-CM | POA: Diagnosis not present

## 2017-04-26 MED ORDER — IOPAMIDOL (ISOVUE-300) INJECTION 61%
INTRAVENOUS | Status: AC
  Administered 2017-04-26: 100 mL via INTRAVENOUS
  Filled 2017-04-26: qty 100

## 2017-04-26 MED ORDER — IOPAMIDOL (ISOVUE-300) INJECTION 61%
100.0000 mL | Freq: Once | INTRAVENOUS | Status: AC | PRN
  Administered 2017-04-26: 100 mL via INTRAVENOUS

## 2017-04-27 ENCOUNTER — Telehealth: Payer: Self-pay | Admitting: Gynecologic Oncology

## 2017-04-27 NOTE — Telephone Encounter (Signed)
Patient informed of CT scan results along with Dr. Mora Bellman for repeat CT in 3 months.  Patient is concerned that this will be the 4th CT scan that she has had since October.  Advised that I will relay her concerns to Dr. Fermin Schwab and give her a call back with his recommendations.  No concerns voiced.  Advised to call for any needs in between that time.

## 2017-05-02 ENCOUNTER — Telehealth: Payer: Self-pay

## 2017-05-02 ENCOUNTER — Other Ambulatory Visit: Payer: Self-pay | Admitting: Gynecologic Oncology

## 2017-05-02 DIAGNOSIS — R1907 Generalized intra-abdominal and pelvic swelling, mass and lump: Secondary | ICD-10-CM

## 2017-05-02 NOTE — Telephone Encounter (Signed)
Told Emma Greene that Dr. Fermin Schwab feels that a repeat CT scan is the best modality to f/u on the cystic lesions seen on CT Scan 04-26-17. Pt agreeable to CT. Pt given appointment for 5-28=19 at 0830 at [facility] for ct scan. Reviewed instructions. Pt to P/U contrast at [facility] radiology the day prior to scan. Follow up appointment is scheduled for 08-05-17 at 0915 with Dr. Fermin Schwab. Pt wrote appointments down on her calendar.

## 2017-07-26 ENCOUNTER — Encounter: Payer: Self-pay | Admitting: Gynecologic Oncology

## 2017-07-26 ENCOUNTER — Telehealth: Payer: Self-pay | Admitting: Oncology

## 2017-07-26 ENCOUNTER — Telehealth: Payer: Self-pay | Admitting: Gynecologic Oncology

## 2017-07-26 ENCOUNTER — Ambulatory Visit (HOSPITAL_COMMUNITY)
Admission: RE | Admit: 2017-07-26 | Discharge: 2017-07-26 | Disposition: A | Discharge status: Home / Self Care | Payer: BLUE CROSS/BLUE SHIELD | Source: Ambulatory Visit | Attending: Gynecologic Oncology | Admitting: Gynecologic Oncology

## 2017-07-26 DIAGNOSIS — N200 Calculus of kidney: Secondary | ICD-10-CM | POA: Insufficient documentation

## 2017-07-26 DIAGNOSIS — R935 Abnormal findings on diagnostic imaging of other abdominal regions, including retroperitoneum: Secondary | ICD-10-CM | POA: Insufficient documentation

## 2017-07-26 DIAGNOSIS — R1907 Generalized intra-abdominal and pelvic swelling, mass and lump: Secondary | ICD-10-CM | POA: Diagnosis not present

## 2017-07-26 DIAGNOSIS — K760 Fatty (change of) liver, not elsewhere classified: Secondary | ICD-10-CM | POA: Insufficient documentation

## 2017-07-26 MED ORDER — IOPAMIDOL (ISOVUE-300) INJECTION 61%
INTRAVENOUS | Status: AC
  Filled 2017-07-26: qty 100

## 2017-07-26 MED ORDER — TAMSULOSIN HCL 0.4 MG PO CAPS: 0.4000 mg | ORAL_CAPSULE | Freq: Every day | ORAL | 0 refills | Status: DC

## 2017-07-26 MED ORDER — IOPAMIDOL (ISOVUE-300) INJECTION 61%
100.0000 mL | Freq: Once | INTRAVENOUS | Status: AC | PRN
  Administered 2017-07-26: 100 mL via INTRAVENOUS

## 2017-07-26 MED ORDER — OXYCODONE-ACETAMINOPHEN 5-325 MG PO TABS: 1.0000 | ORAL_TABLET | Freq: Four times a day (QID) | ORAL | 0 refills | Status: DC | PRN

## 2017-07-26 NOTE — Progress Notes (Signed)
Patient presented to the office without an appointment after having her CT scan.  She reported blood in urine and left flank pain/cramping.  She remembers having a left kidney stone noted on her previous CT scan.  She denies dysuria, frequency, or urgency but does state she has not been voiding as much.  No fever or chills.  Stating she feels pretty good overall.  She has never seen a urologist.  Patient does not feel that urine needs to be tested today to rule out infection.  CT images reviewed showing the left renal stone in a different location than previous scan.  Advised patient that we will wait for the final report and will touch base with [facility] as well. Advised to call for any needs or concerns.

## 2017-07-26 NOTE — Telephone Encounter (Signed)
George Ina, triage nurse, at [facility].  Asked if patient would need to be seen due to 6 mm calculus in left renal pelvis seen on CT AP from today.  She said patient does not need to be seen urgently because it is not obstructing the ureter and because there is not any hydronephrosis.  She said she should be seen next week.  She asked if a copy of the CT and latest office note can be faxed to [phone].  She also recommended providing patient with pain medication, nausea medication and Flomax 0.4 mg daily.  Joylene John, NP notified.

## 2017-07-26 NOTE — Telephone Encounter (Signed)
Spoke with patient about CT scan findings.  Also advised patient that our office spoke with an RN at [facility] who recommended referral for renal calculus seen on CT imaging.  Also recommended per [facility] to have patient begin taking flomax 0.4 mg daily along with pain medication for left flank pain.  Patient states she had a period several mths ago but denies any chance that she may be pregnant.  Patient advised she would be contacted with a date and time to meet with the urologist.  Advised to take the percocet for severe pain only.  She is also advised to follow up with Dr. Fermin Schwab as scheduled.

## 2017-07-29 ENCOUNTER — Other Ambulatory Visit: Payer: Self-pay | Admitting: Urology

## 2017-08-01 ENCOUNTER — Encounter (HOSPITAL_COMMUNITY): Payer: Self-pay | Admitting: *Deleted

## 2017-08-01 ENCOUNTER — Telehealth: Payer: Self-pay | Admitting: Gynecology

## 2017-08-01 ENCOUNTER — Telehealth: Payer: Self-pay | Admitting: *Deleted

## 2017-08-01 ENCOUNTER — Other Ambulatory Visit: Payer: Self-pay

## 2017-08-01 NOTE — Telephone Encounter (Signed)
Patient left message on voicemail regarding appt.  I transferred call to [place] 6/3

## 2017-08-01 NOTE — Telephone Encounter (Signed)
Patient called and moved her appt from June 7th to July 9th

## 2017-08-02 MED ORDER — DEXTROSE 5 % IV SOLN
3.0000 g | INTRAVENOUS | Status: AC
  Administered 2017-08-03: 3 g via INTRAVENOUS
  Filled 2017-08-02: qty 3

## 2017-08-02 NOTE — Progress Notes (Addendum)
Left voice mail message for patient arrive 1030 am [place] admiiting clear liquids until 700 am then npo, call [phone] to confirm message received. Patient called back at 1230 and spoke with patient and patient stated she understands all directions concerning voive mail with directions concerning surgery time change left

## 2017-08-03 ENCOUNTER — Encounter (HOSPITAL_COMMUNITY)
Admission: RE | Disposition: A | Discharge status: Home / Self Care | Payer: Self-pay | Source: Ambulatory Visit | Attending: Urology

## 2017-08-03 ENCOUNTER — Ambulatory Visit (HOSPITAL_COMMUNITY): Payer: BLUE CROSS/BLUE SHIELD

## 2017-08-03 ENCOUNTER — Ambulatory Visit (HOSPITAL_COMMUNITY): Payer: BLUE CROSS/BLUE SHIELD | Admitting: Anesthesiology

## 2017-08-03 ENCOUNTER — Ambulatory Visit (HOSPITAL_COMMUNITY)
Admission: RE | Admit: 2017-08-03 | Discharge: 2017-08-03 | Disposition: A | Discharge status: Home / Self Care | Payer: BLUE CROSS/BLUE SHIELD | Source: Ambulatory Visit | Attending: Urology | Admitting: Urology

## 2017-08-03 ENCOUNTER — Encounter (HOSPITAL_COMMUNITY): Payer: Self-pay | Admitting: *Deleted

## 2017-08-03 ENCOUNTER — Other Ambulatory Visit: Payer: Self-pay

## 2017-08-03 DIAGNOSIS — N201 Calculus of ureter: Secondary | ICD-10-CM | POA: Insufficient documentation

## 2017-08-03 DIAGNOSIS — Z6841 Body Mass Index (BMI) 40.0 and over, adult: Secondary | ICD-10-CM | POA: Diagnosis not present

## 2017-08-03 DIAGNOSIS — Z87891 Personal history of nicotine dependence: Secondary | ICD-10-CM | POA: Diagnosis not present

## 2017-08-03 DIAGNOSIS — Q6212 Congenital occlusion of ureterovesical orifice: Secondary | ICD-10-CM | POA: Diagnosis not present

## 2017-08-03 DIAGNOSIS — R011 Cardiac murmur, unspecified: Secondary | ICD-10-CM | POA: Diagnosis not present

## 2017-08-03 DIAGNOSIS — I10 Essential (primary) hypertension: Secondary | ICD-10-CM | POA: Diagnosis not present

## 2017-08-03 DIAGNOSIS — K219 Gastro-esophageal reflux disease without esophagitis: Secondary | ICD-10-CM | POA: Insufficient documentation

## 2017-08-03 DIAGNOSIS — Z79899 Other long term (current) drug therapy: Secondary | ICD-10-CM | POA: Insufficient documentation

## 2017-08-03 HISTORY — PX: URETEROSCOPY WITH HOLMIUM LASER LITHOTRIPSY: SHX6645

## 2017-08-03 HISTORY — DX: Essential (primary) hypertension: I10

## 2017-08-03 HISTORY — DX: Family history of other specified conditions: Z84.89

## 2017-08-03 HISTORY — DX: Personal history of urinary calculi: Z87.442

## 2017-08-03 HISTORY — DX: Gastro-esophageal reflux disease without esophagitis: K21.9

## 2017-08-03 LAB — CBC
HEMATOCRIT: 36.6 % (ref 36.0–46.0)
HEMOGLOBIN: 11.4 g/dL — AB (ref 12.0–15.0)
MCH: 25.3 pg — ABNORMAL LOW (ref 26.0–34.0)
MCHC: 31.1 g/dL (ref 30.0–36.0)
MCV: 81.2 fL (ref 78.0–100.0)
Platelets: 209 10*3/uL (ref 150–400)
RBC: 4.51 MIL/uL (ref 3.87–5.11)
RDW: 15.9 % — AB (ref 11.5–15.5)
WBC: 6.2 10*3/uL (ref 4.0–10.5)

## 2017-08-03 LAB — BASIC METABOLIC PANEL
ANION GAP: 8 (ref 5–15)
BUN: 19 mg/dL (ref 6–20)
CO2: 26 mmol/L (ref 22–32)
Calcium: 9.5 mg/dL (ref 8.9–10.3)
Chloride: 103 mmol/L (ref 101–111)
Creatinine, Ser: 0.84 mg/dL (ref 0.44–1.00)
GFR calc Af Amer: >60 mL/min (ref >60)
GLUCOSE: 105 mg/dL — AB (ref 65–99)
POTASSIUM: 4.7 mmol/L (ref 3.5–5.1)
Sodium: 137 mmol/L (ref 135–145)

## 2017-08-03 LAB — HCG, SERUM, QUALITATIVE: Preg, Serum: NEGATIVE

## 2017-08-03 SURGERY — URETEROSCOPY, WITH LITHOTRIPSY USING HOLMIUM LASER
Anesthesia: General | Laterality: Left

## 2017-08-03 MED ORDER — LACTATED RINGERS IV SOLN
INTRAVENOUS | Status: DC
  Administered 2017-08-03 (×2): via INTRAVENOUS

## 2017-08-03 MED ORDER — FENTANYL CITRATE (PF) 100 MCG/2ML IJ SOLN
INTRAMUSCULAR | Status: DC | PRN
  Administered 2017-08-03 (×4): 50 ug via INTRAVENOUS

## 2017-08-03 MED ORDER — ONDANSETRON HCL 4 MG/2ML IJ SOLN
INTRAMUSCULAR | Status: AC
  Filled 2017-08-03: qty 2

## 2017-08-03 MED ORDER — MIDAZOLAM HCL 2 MG/2ML IJ SOLN
INTRAMUSCULAR | Status: AC
  Filled 2017-08-03: qty 2

## 2017-08-03 MED ORDER — OXYCODONE HCL 5 MG/5ML PO SOLN
5.0000 mg | Freq: Once | ORAL | Status: DC | PRN
  Filled 2017-08-03: qty 5

## 2017-08-03 MED ORDER — LIDOCAINE 2% (20 MG/ML) 5 ML SYRINGE
INTRAMUSCULAR | Status: DC | PRN
  Administered 2017-08-03: 100 mg via INTRAVENOUS

## 2017-08-03 MED ORDER — DEXAMETHASONE SODIUM PHOSPHATE 10 MG/ML IJ SOLN
INTRAMUSCULAR | Status: AC
  Filled 2017-08-03: qty 1

## 2017-08-03 MED ORDER — ONDANSETRON HCL 4 MG/2ML IJ SOLN: 4.0000 mg | Freq: Four times a day (QID) | INTRAMUSCULAR | Status: DC | PRN

## 2017-08-03 MED ORDER — IOHEXOL 300 MG/ML  SOLN
INTRAMUSCULAR | Status: DC | PRN
  Administered 2017-08-03: 10 mL

## 2017-08-03 MED ORDER — FENTANYL CITRATE (PF) 100 MCG/2ML IJ SOLN
INTRAMUSCULAR | Status: AC
  Filled 2017-08-03: qty 2

## 2017-08-03 MED ORDER — ONDANSETRON HCL 4 MG PO TABS: 4.0000 mg | ORAL_TABLET | Freq: Every day | ORAL | 1 refills | Status: DC | PRN

## 2017-08-03 MED ORDER — CEPHALEXIN 500 MG PO CAPS: 500.0000 mg | ORAL_CAPSULE | Freq: Three times a day (TID) | ORAL | 0 refills | Status: DC

## 2017-08-03 MED ORDER — FENTANYL CITRATE (PF) 100 MCG/2ML IJ SOLN
25.0000 ug | INTRAMUSCULAR | Status: DC | PRN
  Administered 2017-08-03 (×2): 50 ug via INTRAVENOUS

## 2017-08-03 MED ORDER — OXYCODONE HCL 5 MG PO TABS: 5.0000 mg | ORAL_TABLET | Freq: Once | ORAL | Status: DC | PRN

## 2017-08-03 MED ORDER — PHENAZOPYRIDINE HCL 200 MG PO TABS: 200.0000 mg | ORAL_TABLET | Freq: Three times a day (TID) | ORAL | 0 refills | Status: DC | PRN

## 2017-08-03 MED ORDER — SODIUM CHLORIDE 0.9 % IR SOLN
Status: DC | PRN
  Administered 2017-08-03: 3000 mL

## 2017-08-03 MED ORDER — ONDANSETRON HCL 4 MG/2ML IJ SOLN
INTRAMUSCULAR | Status: DC | PRN
  Administered 2017-08-03: 4 mg via INTRAVENOUS

## 2017-08-03 MED ORDER — PROPOFOL 10 MG/ML IV BOLUS
INTRAVENOUS | Status: DC | PRN
  Administered 2017-08-03: 200 mg via INTRAVENOUS

## 2017-08-03 MED ORDER — PROPOFOL 10 MG/ML IV BOLUS
INTRAVENOUS | Status: AC
  Filled 2017-08-03: qty 20

## 2017-08-03 MED ORDER — DEXAMETHASONE SODIUM PHOSPHATE 10 MG/ML IJ SOLN
INTRAMUSCULAR | Status: DC | PRN
  Administered 2017-08-03: 10 mg via INTRAVENOUS

## 2017-08-03 MED ORDER — HYDROCODONE-ACETAMINOPHEN 5-325 MG PO TABS: 1.0000 | ORAL_TABLET | ORAL | 0 refills | Status: DC | PRN

## 2017-08-03 MED ORDER — MIDAZOLAM HCL 5 MG/5ML IJ SOLN
INTRAMUSCULAR | Status: DC | PRN
  Administered 2017-08-03: 2 mg via INTRAVENOUS

## 2017-08-03 SURGICAL SUPPLY — 22 items
BAG URO CATCHER STRL LF (MISCELLANEOUS) ×2 IMPLANT
BASKET ZERO TIP NITINOL 2.4FR (BASKET) IMPLANT
CATH URET 5FR 28IN OPEN ENDED (CATHETERS) ×2 IMPLANT
CATH URET DUAL LUMEN 6-10FR 50 (CATHETERS) ×2 IMPLANT
CLOTH BEACON ORANGE TIMEOUT ST (SAFETY) ×2 IMPLANT
COVER FOOTSWITCH UNIV (MISCELLANEOUS) ×2 IMPLANT
DRAPE C-ARM 42X120 X-RAY (DRAPES) IMPLANT
EXTRACTOR STONE NITINOL NGAGE (UROLOGICAL SUPPLIES) IMPLANT
FIBER LASER FLEXIVA 365 (UROLOGICAL SUPPLIES) IMPLANT
FIBER LASER TRAC TIP (UROLOGICAL SUPPLIES) ×2 IMPLANT
GLOVE BIOGEL M STRL SZ7.5 (GLOVE) ×2 IMPLANT
GOWN STRL REUS W/TWL LRG LVL3 (GOWN DISPOSABLE) ×2 IMPLANT
GOWN STRL REUS W/TWL XL LVL3 (GOWN DISPOSABLE) ×2 IMPLANT
GUIDEWIRE ANG ZIPWIRE 038X150 (WIRE) ×2 IMPLANT
GUIDEWIRE STR DUAL SENSOR (WIRE) ×2 IMPLANT
MANIFOLD NEPTUNE II (INSTRUMENTS) ×2 IMPLANT
PACK CYSTO (CUSTOM PROCEDURE TRAY) ×2 IMPLANT
SHEATH URETERAL 12FRX35CM (MISCELLANEOUS) IMPLANT
STENT URET 6FRX24 CONTOUR (STENTS) ×2 IMPLANT
STENT URET 6FRX26 CONTOUR (STENTS) IMPLANT
TUBING CONNECTING 10 (TUBING) ×2 IMPLANT
TUBING UROLOGY SET (TUBING) ×2 IMPLANT

## 2017-08-03 NOTE — Anesthesia Procedure Notes (Signed)
Procedure Name: LMA Insertion Date/Time: 08/03/2017 1:05 PM Performed by: [place] Ivannah Zody C, CRNA Pre-anesthesia Checklist: Patient identified, Emergency Drugs available, Suction available and Patient being monitored Patient Re-evaluated:Patient Re-evaluated prior to induction Oxygen Delivery Method: Circle system utilized Preoxygenation: Pre-oxygenation with 100% oxygen Induction Type: IV induction Ventilation: Mask ventilation without difficulty LMA: LMA inserted LMA Size: 4.0 Tube size: 4.0 mm Number of attempts: 1 Airway Equipment and Method: Bite block Placement Confirmation: positive ETCO2 Tube secured with: Tape Dental Injury: Teeth and Oropharynx as per pre-operative assessment

## 2017-08-03 NOTE — Op Note (Signed)
Operative Note  Preoperative diagnosis:  1.  6 mm left UPJ stone  Postoperative diagnosis: 1.  Same  Procedure(s): 1.  Cystoscopy 2.  Left retrograde pyelogram interpretation 3.  Left flexible ureteroscopy  4.  Left holmium laser lithotripsy 5.  Left JJ stent placement  Surgeon: Ellison Hughs, MD  Assistants: None   Anesthesia: General  Complications:  None  EBL:  <5 mL  Specimens: 1. None  Drains/Catheters: 1.  Left 6 French x 24 cm JJ stent without tether   Intraoperative findings:   1. Stenotic left ureteral orifice  2. Solitary left collecting system with no filling defects or dilation involving the left ureter or left renal pelvis seen on retrograde pyelogram   Indication:  Emma Greene is a 52 y.o. female with a 1 week history of intermittent episodes of sharp, non-radiating left flank pain. She went to the ED on 07/26/17 2/2 her pain and was found to have a 6 mm left UPJ stone w/o hydro. She has been aware of her stone since last September. She report dark brown urine with hematuria. Today, she denies nausea/vomiting or fever/chills. She reports one prior stone episode in her late twenties that did not require surgery. Hx of ovarian and liver cysts that has required her to get CT scans q 3 months to monitor (non-cancerous at this point).  She has been consented for the above procedures, voices understanding to proceed.  Description of procedure:  After informed consent was obtained, the patient was brought to the operating room and general LMA anesthesia was administered. The patient was then placed in the dorsolithotomy position and prepped and draped in usual sterile fashion. A timeout was performed. A 23 French rigid cystoscope was then inserted into the urethral meatus and advanced into the bladder under direct vision. A complete bladder survey revealed no intravesical pathology.  Her left ureteral orifice was found to be slightly stenotic on cystoscopy.  A  Glidewire was used to intubate the ureter and a 5 French opening catheter was easily advanced to position the ureter.  A left retrograde pyelogram was obtained, with the findings listed above.  The ureteral catheter was then removed, was placed.  A 10 French dual-lumen catheter was advanced over the wire into the distal aspects of the left ureter, allowing dilation of the ureteral orifice.  The dual-lumen catheter was then removed, leaving the wire in place.  A ureteroscope was advanced alongside the wire and up to the level of her obstructing proximal ureteral stone.  The stone migrated into a midpole calyx.  A 200 m laser was used to fracture the stone into dust.  The flexible ureteroscope was in place.  A 6 French by 24 cm stent was then placed over the wire and into good position of the left collecting system, confirming placement via fluoroscopy.  The patient's bladder was then completely drained.  She tolerated the procedure well and transferred to the stable condition.  Plan: Follow-up in 1 week for office cystoscopy and stent removal.  Follow-up in 6 weeks for left RUS

## 2017-08-03 NOTE — Anesthesia Postprocedure Evaluation (Signed)
Anesthesia Post Note  Patient: Emma Greene  Procedure(s) Performed: URETEROSCOPY WITH HOLMIUM LASER LITHOTRIPSY/ STENT PLACEMENT (Left )     Patient location during evaluation: PACU Anesthesia Type: General Level of consciousness: awake and alert Pain management: pain level controlled Vital Signs Assessment: post-procedure vital signs reviewed and stable Respiratory status: spontaneous breathing, nonlabored ventilation, respiratory function stable and patient connected to nasal cannula oxygen Cardiovascular status: blood pressure returned to baseline and stable Postop Assessment: no apparent nausea or vomiting Anesthetic complications: no    Last Vitals:  Vitals:   08/03/17 1445 08/03/17 1506  BP: 133/80 (!) 153/98  Pulse: 64 65  Resp: (!) 8 18  Temp: 36.8 C 36.7 C  SpO2: 95% 96%    Last Pain:  Vitals:   08/03/17 1506  TempSrc: Oral  PainSc:                  [place]

## 2017-08-03 NOTE — H&P (Signed)
Urology Preoperative H&P   Chief Complaint: Left flank pain  History of Present Illness: Emma Greene is a 52 y.o. female with a 1 week history of intermittent episodes of sharp, non-radiating left flank pain. She went to the ED on 07/26/17 2/2 her pain and was found to have a 6 mm left UPJ stone w/o hydro. She has been aware of her stone since last September. She report dark brown urine with hematuria. Today, she denies nausea/vomiting or fever/chills. She reports one prior stone episode in her late twenties that did not require surgery. Hx of ovarian and liver cysts that has required her to get CT scans q 3 months to monitor (non-cancerous at this point).  Past Medical History:  Diagnosis Date  . Allergy   . Anemia   . Family history of adverse reaction to anesthesia    motehr- N/V   . GERD (gastroesophageal reflux disease)   . Heart murmur   . History of kidney stones   . Hypertension     Past Surgical History:  Procedure Laterality Date  . CHOLECYSTECTOMY    . HYSTEROSCOPY     with D&C  . OOPHORECTOMY    . right salpingo-oophorrectomy    . TONSILLECTOMY    . TUBAL LIGATION      Allergies: No Known Allergies  Family History  Problem Relation Age of Onset  . Heart disease Father   . Hyperlipidemia Mother   . Hypertension Mother   . Cancer Maternal Grandfather        lung- smoker  . Hypertension Brother   . Heart disease Maternal Grandmother   . Hyperlipidemia Maternal Grandmother   . Hypertension Maternal Grandmother   . Heart disease Paternal Grandmother   . Heart disease Paternal Grandfather     Social History:  reports that she quit smoking about 2 years ago. She has never used smokeless tobacco. She reports that she does not drink alcohol or use drugs.  ROS: A complete review of systems was performed.  All systems are negative except for pertinent findings as noted.  Physical Exam:  Vital signs in last 24 hours:   Constitutional:  Alert and oriented, No  acute distress Cardiovascular: Regular rate and rhythm, No JVD Respiratory: Normal respiratory effort, Lungs clear bilaterally GI: Abdomen is soft, nontender, nondistended, no abdominal masses GU: Left CVA tenderness Lymphatic: No lymphadenopathy Neurologic: Grossly intact, no focal deficits Psychiatric: Normal mood and affect  Laboratory Data:  No results for input(s): WBC, HGB, HCT, PLT in the last 72 hours.  No results for input(s): NA, K, CL, GLUCOSE, BUN, CALCIUM, CREATININE in the last 72 hours.  Invalid input(s): CO3   No results found for this or any previous visit (from the past 24 hour(s)). No results found for this or any previous visit (from the past 240 hour(s)).  Renal Function: No results for input(s): CREATININE in the last 168 hours. CrCl cannot be calculated (Patient's most recent lab result is older than the maximum 21 days allowed.).  Radiologic Imaging: No results found.  I independently reviewed the above imaging studies.  Assessment and Plan Emma Greene is a 52 y.o. female with a 6 mm left UPJ stone w/o hydronephrosis  .The risks, benefits and alternatives of cystoscopy with LEFT ureteroscopy, laser lithotripsy and ureteral stent placement was discussed the patient.  Risks included, but are not limited to: bleeding, urinary tract infection, ureteral injury/avulsion, ureteral stricture formation, retained stone fragments, the possibility that multiple surgeries may be required  to treat the stone(s), MI, stroke, PE and the inherent risks of general anesthesia.  The patient voices understanding and wishes to proceed.     Emma Hughs, MD 08/03/2017, 7:10 AM  [facility] Pager: [phone]

## 2017-08-03 NOTE — Anesthesia Preprocedure Evaluation (Signed)
Anesthesia Evaluation  Patient identified by MRN, date of birth, ID band Patient awake    Reviewed: Allergy & Precautions, H&P , NPO status , Patient's Chart, lab work & pertinent test results  Airway Mallampati: II   Neck ROM: full    Dental   Pulmonary former smoker,    breath sounds clear to auscultation       Cardiovascular hypertension,  Rhythm:regular Rate:Normal     Neuro/Psych  Neuromuscular disease    GI/Hepatic GERD  ,  Endo/Other  Morbid obesity  Renal/GU stones     Musculoskeletal   Abdominal   Peds  Hematology   Anesthesia Other Findings   Reproductive/Obstetrics                             Anesthesia Physical Anesthesia Plan  ASA: II  Anesthesia Plan: General   Post-op Pain Management:    Induction: Intravenous  PONV Risk Score and Plan: 3 and Ondansetron, Dexamethasone, Midazolam and Treatment may vary due to age or medical condition  Airway Management Planned: LMA  Additional Equipment:   Intra-op Plan:   Post-operative Plan:   Informed Consent: I have reviewed the patients History and Physical, chart, labs and discussed the procedure including the risks, benefits and alternatives for the proposed anesthesia with the patient or authorized representative who has indicated his/her understanding and acceptance.     Plan Discussed with: CRNA, Anesthesiologist and Surgeon  Anesthesia Plan Comments:         Anesthesia Quick Evaluation

## 2017-08-03 NOTE — Transfer of Care (Addendum)
Immediate Anesthesia Transfer of Care Note  Patient: Emma Greene  Procedure(s) Performed: URETEROSCOPY WITH HOLMIUM LASER LITHOTRIPSY/ STENT PLACEMENT (Left )  Patient Location: PACU  Anesthesia Type:General  Level of Consciousness: awake, alert  and oriented  Airway & Oxygen Therapy: Patient Spontanous Breathing and Patient connected to face mask oxygen  Post-op Assessment: Report given to RN and Post -op Vital signs reviewed and stable  Post vital signs: Reviewed and stable  Last Vitals:  Vitals Value Taken Time  BP 154/88 08/03/2017  1:50 PM  Temp    Pulse 76 08/03/2017  1:51 PM  Resp 16 08/03/2017  1:51 PM  SpO2 100 % 08/03/2017  1:51 PM  Vitals shown include unvalidated device data.  Last Pain:  Vitals:   08/03/17 1105  TempSrc: Oral         Complications: No apparent anesthesia complications

## 2017-08-04 ENCOUNTER — Encounter (HOSPITAL_COMMUNITY): Payer: Self-pay | Admitting: Urology

## 2017-08-04 NOTE — Addendum Note (Signed)
Addendum  created 08/04/17 0719 by Lollie Sails, CRNA   Charge Capture section accepted

## 2017-08-05 ENCOUNTER — Ambulatory Visit: Payer: BLUE CROSS/BLUE SHIELD | Admitting: Gynecology

## 2017-09-06 ENCOUNTER — Inpatient Hospital Stay: Payer: BLUE CROSS/BLUE SHIELD | Admitting: Gynecology

## 2017-10-25 ENCOUNTER — Inpatient Hospital Stay: Payer: BLUE CROSS/BLUE SHIELD | Attending: Gynecology | Admitting: Gynecology

## 2017-10-25 ENCOUNTER — Encounter: Payer: Self-pay | Admitting: Gynecology

## 2017-10-25 ENCOUNTER — Other Ambulatory Visit (HOSPITAL_COMMUNITY)
Admission: RE | Admit: 2017-10-25 | Discharge: 2017-10-25 | Disposition: A | Discharge status: Home / Self Care | Payer: BLUE CROSS/BLUE SHIELD | Source: Ambulatory Visit | Attending: Gynecology | Admitting: Gynecology

## 2017-10-25 VITALS — BP 135/73 | HR 69 | Temp 99.2°F | Resp 20 | Ht 64.0 in | Wt 326.8 lb

## 2017-10-25 DIAGNOSIS — N839 Noninflammatory disorder of ovary, fallopian tube and broad ligament, unspecified: Secondary | ICD-10-CM | POA: Insufficient documentation

## 2017-10-25 DIAGNOSIS — N939 Abnormal uterine and vaginal bleeding, unspecified: Secondary | ICD-10-CM | POA: Diagnosis present

## 2017-10-25 DIAGNOSIS — Z87891 Personal history of nicotine dependence: Secondary | ICD-10-CM | POA: Insufficient documentation

## 2017-10-25 DIAGNOSIS — D3910 Neoplasm of uncertain behavior of unspecified ovary: Secondary | ICD-10-CM | POA: Diagnosis not present

## 2017-10-25 DIAGNOSIS — N838 Other noninflammatory disorders of ovary, fallopian tube and broad ligament: Secondary | ICD-10-CM

## 2017-10-25 NOTE — Patient Instructions (Signed)
Plan to have an ultrasound to evaluate the endometrial stripe and follow up on ovarian cysts.  We will call you with the results.    If your ultrasound is normal, you can return to the care of your GYN.  Please call for any questions or concerns.

## 2017-10-25 NOTE — Progress Notes (Signed)
Consult Note: Gyn-Onc   Emma Greene 52 y.o. female  Chief Complaint  Patient presents with  . Ovarian mass, left    Assessment : Pelvic cysts that appear to be benign in nature.   Intermittent spotting.  Is unclear whether the patient is actually menopausal.  Plan: I we will schedule a transvaginal ultrasound to evaluate endometrial stripe and follow-up on the ovarian cysts..  Pap smear is obtained today.  She will schedule colonoscopy.  Interval history: Patient returns today for follow-up.  Since I saw her last she underwent ureteroscopy and removal of a renal stone.  From a gynecologic point of view she denies any pelvic pain or pressure.  She does note she has some spotting a couple weeks ago.  Is unclear as whether the patient is actually menopausal.  Her last Pap smear was in December 2016.  She denies any other GI or GU symptoms.   HPI: 52 year old menopausal woman seen in consultation request of Dr. Abbie Sons regarding management of pelvic cysts.  In September 2018 the patient presented to urgent care and to the emergency room with acute abdominal pain which apparently resolved spontaneously.  However in the course of the workup to ovarian cysts were detected on CT scan.  A follow-up CT scan on January 24, 2017 noted "multiple small masses in the peritoneum the largest measuring 2.3 x 1.7 cm concerning for possible metastatic disease" (that CT scan is not available for review at the present time.  Patient had yet another CT scan on January 31, 2017 showing bilateral ovarian cyst measuring 2.5 on the right and 4.1 x 5.3 on the left these are similar to the cyst identified in September and there is no change.  The patient recently had a ultrasound of the pelvis showing a echo-free cystic mass on the right adnexal region measuring 4 x 3.7 cm.  On the left are 2 cystic masses measuring 3.9 x 4.3 x 4.0 cm and a second measuring 3.8 x 2.7 x 3.1 cm.  In addition there is a tubular  shaped cystic mass measuring 4.8 x 3.2 cm.  Ca1 25 is 10 units/mL.  The patient indicates that she does not feel any pelvic pain or pressure.  In May 2019 the patient had a CT scan showing all cysts were stable.  Review of Systems:10 point review of systems is negative except as noted in interval history.   Vitals: Blood pressure 135/73, pulse 69, temperature 99.2 F (37.3 C), temperature source Oral, resp. rate 20, height 5\' 4"  (1.626 m), weight (!) 326 lb 12.8 oz (148.2 kg), SpO2 99 %.  Physical Exam: General : The patient is a healthy woman in no acute distress.  HEENT: normocephalic, extraoccular movements normal; neck is supple without thyromegally  Lynphnodes: Supraclavicular and inguinal nodes not enlarged  Abdomen: Obese, soft, non-tender, no ascites, no organomegally, no masses, no hernias  Pelvic:  EGBUS: Normal female  Vagina: Normal, no lesions  Urethra and Bladder: Normal, non-tender  Cervix: Normal Uterus: Difficult to evaluate given the patient's body habitus. Bi-manual examination: Non-tender; no adenxal masses or nodularity  Rectal: normal sphincter tone, no masses, no blood  Lower extremities: No edema or varicosities. Normal range of motion      No Known Allergies  Past Medical History:  Diagnosis Date  . Allergy   . Anemia   . Family history of adverse reaction to anesthesia    motehr- N/V   . GERD (gastroesophageal reflux disease)   . Heart  murmur   . History of kidney stones   . Hypertension     Past Surgical History:  Procedure Laterality Date  . CHOLECYSTECTOMY    . HYSTEROSCOPY     with D&C  . OOPHORECTOMY    . right salpingo-oophorrectomy    . TONSILLECTOMY    . TUBAL LIGATION    . URETEROSCOPY WITH HOLMIUM LASER LITHOTRIPSY Left 08/03/2017   Procedure: URETEROSCOPY WITH HOLMIUM LASER LITHOTRIPSY/ STENT PLACEMENT;  Surgeon: Ceasar Mons, MD;  Location: [facility];  Service: Urology;  Laterality: Left;    Current Outpatient  Medications  Medication Sig Dispense Refill  . ibuprofen (ADVIL,MOTRIN) 200 MG tablet Take 400 mg by mouth daily as needed for headache or moderate pain.    Marland Kitchen lisinopril-hydrochlorothiazide (PRINZIDE,ZESTORETIC) 10-12.5 MG tablet Take 1 tablet by mouth daily.    . cephALEXin (KEFLEX) 500 MG capsule Take 1 capsule (500 mg total) by mouth 3 (three) times daily. (Patient not taking: Reported on 10/25/2017) 9 capsule 0   No current facility-administered medications for this visit.     Social History   Socioeconomic History  . Marital status: Divorced    Spouse name: Not on file  . Number of children: Not on file  . Years of education: Not on file  . Highest education level: Not on file  Occupational History  . Not on file  Social Needs  . Financial resource strain: Not on file  . Food insecurity:    Worry: Not on file    Inability: Not on file  . Transportation needs:    Medical: Not on file    Non-medical: Not on file  Tobacco Use  . Smoking status: Former Smoker    Last attempt to quit: 02/10/2015    Years since quitting: 2.7  . Smokeless tobacco: Never Used  Substance and Sexual Activity  . Alcohol use: No    Alcohol/week: 0.0 standard drinks  . Drug use: No  . Sexual activity: Yes  Lifestyle  . Physical activity:    Days per week: Not on file    Minutes per session: Not on file  . Stress: Not on file  Relationships  . Social connections:    Talks on phone: Not on file    Gets together: Not on file    Attends religious service: Not on file    Active member of club or organization: Not on file    Attends meetings of clubs or organizations: Not on file    Relationship status: Not on file  . Intimate partner violence:    Fear of current or ex partner: Not on file    Emotionally abused: Not on file    Physically abused: Not on file    Forced sexual activity: Not on file  Other Topics Concern  . Not on file  Social History Narrative  . Not on file    Family  History  Problem Relation Age of Onset  . Heart disease Father   . Hyperlipidemia Mother   . Hypertension Mother   . Cancer Maternal Grandfather        lung- smoker  . Hypertension Brother   . Heart disease Maternal Grandmother   . Hyperlipidemia Maternal Grandmother   . Hypertension Maternal Grandmother   . Heart disease Paternal Grandmother   . Heart disease Paternal Grandfather       Marti Sleigh, MD 10/25/2017, 9:30 AM

## 2017-10-27 LAB — CYTOLOGY - PAP
Adequacy: ABSENT
DIAGNOSIS: NEGATIVE

## 2017-11-07 ENCOUNTER — Ambulatory Visit (HOSPITAL_COMMUNITY): Payer: BLUE CROSS/BLUE SHIELD

## 2017-11-20 NOTE — Progress Notes (Signed)
[facility] [address], [place] [address] Phone: [phone] Subjective:    [name], am serving as a scribe for Dr. Hulan Saas.  CC: Low back pain  BJY:[id number]  Emma Greene is a 52 y.o. female coming in with complaint of low back pain, lower left and middle of lumbar spine. Patient feels like she is shifted to her lift. Patient has been having back pain for the past 3 months. Had kidney stone surgery and had some back pain immediately after surgery. Isn't sure if they moved her around a lot during surgery. Has tried prednisone for 3 days, ketoralac and a muscle relaxer. Has had some numbness and weakness in left leg.  Has tried to use heat. Moving seems to help loosen that area if she can get there despite her pain. Was having pain with sleeping and sitting for long durations.    Past medical history significant for pelvic cysts that seem to be ovarian.  Patient did have a CT scan in December 2018 that showed bilateral ovarian cyst and most recent ultrasound showed mild enlargement.  Repeat CT scan in 20 19 May did not show any significant enlargement of the cyst.  Past Medical History:  Diagnosis Date  . Allergy   . Anemia   . Family history of adverse reaction to anesthesia    motehr- N/V   . GERD (gastroesophageal reflux disease)   . Heart murmur   . History of kidney stones   . Hypertension    Past Surgical History:  Procedure Laterality Date  . CHOLECYSTECTOMY    . HYSTEROSCOPY     with D&C  . OOPHORECTOMY    . right salpingo-oophorrectomy    . TONSILLECTOMY    . TUBAL LIGATION    . URETEROSCOPY WITH HOLMIUM LASER LITHOTRIPSY Left 08/03/2017   Procedure: URETEROSCOPY WITH HOLMIUM LASER LITHOTRIPSY/ STENT PLACEMENT;  Surgeon: Ceasar Mons, MD;  Location: [facility];  Service: Urology;  Laterality: Left;   Social History   Socioeconomic History  . Marital status: Divorced    Spouse name: Not on file  .  Number of children: Not on file  . Years of education: Not on file  . Highest education level: Not on file  Occupational History  . Not on file  Social Needs  . Financial resource strain: Not on file  . Food insecurity:    Worry: Not on file    Inability: Not on file  . Transportation needs:    Medical: Not on file    Non-medical: Not on file  Tobacco Use  . Smoking status: Former Smoker    Last attempt to quit: 02/10/2015    Years since quitting: 2.7  . Smokeless tobacco: Never Used  Substance and Sexual Activity  . Alcohol use: No    Alcohol/week: 0.0 standard drinks  . Drug use: No  . Sexual activity: Yes  Lifestyle  . Physical activity:    Days per week: Not on file    Minutes per session: Not on file  . Stress: Not on file  Relationships  . Social connections:    Talks on phone: Not on file    Gets together: Not on file    Attends religious service: Not on file    Active member of club or organization: Not on file    Attends meetings of clubs or organizations: Not on file    Relationship status: Not on file  Other Topics Concern  .  Not on file  Social History Narrative  . Not on file   No Known Allergies Family History  Problem Relation Age of Onset  . Heart disease Father   . Hyperlipidemia Mother   . Hypertension Mother   . Cancer Maternal Grandfather        lung- smoker  . Hypertension Brother   . Heart disease Maternal Grandmother   . Hyperlipidemia Maternal Grandmother   . Hypertension Maternal Grandmother   . Heart disease Paternal Grandmother   . Heart disease Paternal Grandfather      Current Outpatient Medications (Cardiovascular):  .  lisinopril (PRINIVIL,ZESTRIL) 10 MG tablet, Take 1 tablet (10 mg total) by mouth daily.   Current Outpatient Medications (Analgesics):  .  ibuprofen (ADVIL,MOTRIN) 200 MG tablet, Take 400 mg by mouth daily as needed for headache or moderate pain. .  meloxicam (MOBIC) 15 MG tablet, Take 1 tablet (15 mg total)  by mouth daily.   Current Outpatient Medications (Other):  .  cephALEXin (KEFLEX) 500 MG capsule, Take 1 capsule (500 mg total) by mouth 3 (three) times daily. .  Vitamin D, Ergocalciferol, (DRISDOL) 50000 units CAPS capsule, Take 1 capsule (50,000 Units total) by mouth every 7 (seven) days.    Past medical history, social, surgical and family history all reviewed in electronic medical record.  No pertanent information unless stated regarding to the chief complaint.   Review of Systems:  No headache, visual changes, nausea, vomiting, diarrhea, constipation, dizziness, abdominal pain, skin rash, fevers, chills, night sweats, weight loss, swollen lymph nodes, body aches, joint swelling,  chest pain, shortness of breath, mood changes.  Positive muscle aches  Objective  Blood pressure (!) 132/98, pulse 92, height 5\' 4"  (1.626 m), weight (!) 328 lb (148.8 kg), SpO2 98 %.    General: No apparent distress alert and oriented x3 mood and affect normal, dressed appropriately.  The obese HEENT: Pupils equal, extraocular movements intact  Respiratory: Patient's speak in full sentences and does not appear short of breath  Cardiovascular: No lower extremity edema, non tender, no erythema  Skin: Warm dry intact with no signs of infection or rash on extremities or on axial skeleton.  Abdomen: Soft nontender  Neuro: Cranial nerves II through XII are intact, neurovascularly intact in all extremities with 2+ DTRs and 2+ pulses.  Lymph: No lymphadenopathy of posterior or anterior cervical chain or axillae bilaterally.  Gait antalgic MSK:  Non tender with full range of motion and good stability and symmetric strength and tone of shoulders, elbows, wrist, hip, knee and ankles bilaterally.  Mild arthritic changes of the lower extremities bilaterally  Back exam shows poor core strength.  Mild loss of lordosis.  Patient has some pain with resisted flexion of the hips bilaterally.  Tenderness over the sacroiliac  joints.  Difficult to do Corky Sox secondary to patient's body habitus.  Negative straight leg test.  5 out of 5 strength of the feet bilaterally with dorsiflexion and plantarflexion.  Deep tendon reflexes intact  [id number]; 15 additional minutes spent for Therapeutic exercises as stated in above notes.  This included exercises focusing on stretching, strengthening, with significant focus on eccentric aspects.   Long term goals include an improvement in range of motion, strength, endurance as well as avoiding reinjury. Patient's frequency would include in 1-2 times a day, 3-5 times a week for a duration of 6-12 weeks. Low back exercises that included:  Pelvic tilt/bracing instruction to focus on control of the pelvic girdle and lower abdominal muscles  Glute strengthening exercises, focusing on proper firing of the glutes without engaging the low back muscles Proper stretching techniques for maximum relief for the hamstrings, hip flexors, low back and some rotation where tolerated    Proper technique shown and discussed handout in great detail with ATC.  All questions were discussed and answered.       Impression and Recommendations:     This case required medical decision making of moderate complexity. The above documentation has been reviewed and is accurate and complete Lyndal Pulley, DO       Note: This dictation was prepared with Dragon dictation along with smaller phrase technology. Any transcriptional errors that result from this process are unintentional.

## 2017-11-21 ENCOUNTER — Encounter: Payer: Self-pay | Admitting: Family Medicine

## 2017-11-21 ENCOUNTER — Ambulatory Visit (HOSPITAL_COMMUNITY)
Admission: RE | Admit: 2017-11-21 | Discharge: 2017-11-21 | Disposition: A | Discharge status: Home / Self Care | Payer: BLUE CROSS/BLUE SHIELD | Source: Ambulatory Visit | Attending: Gynecologic Oncology | Admitting: Gynecologic Oncology

## 2017-11-21 ENCOUNTER — Ambulatory Visit (INDEPENDENT_AMBULATORY_CARE_PROVIDER_SITE_OTHER): Payer: BLUE CROSS/BLUE SHIELD | Admitting: Family Medicine

## 2017-11-21 DIAGNOSIS — N9489 Other specified conditions associated with female genital organs and menstrual cycle: Secondary | ICD-10-CM | POA: Insufficient documentation

## 2017-11-21 DIAGNOSIS — M533 Sacrococcygeal disorders, not elsewhere classified: Secondary | ICD-10-CM | POA: Diagnosis not present

## 2017-11-21 DIAGNOSIS — R9389 Abnormal findings on diagnostic imaging of other specified body structures: Secondary | ICD-10-CM | POA: Diagnosis not present

## 2017-11-21 DIAGNOSIS — E559 Vitamin D deficiency, unspecified: Secondary | ICD-10-CM | POA: Diagnosis not present

## 2017-11-21 DIAGNOSIS — N950 Postmenopausal bleeding: Secondary | ICD-10-CM | POA: Diagnosis not present

## 2017-11-21 DIAGNOSIS — N839 Noninflammatory disorder of ovary, fallopian tube and broad ligament, unspecified: Secondary | ICD-10-CM | POA: Insufficient documentation

## 2017-11-21 DIAGNOSIS — N838 Other noninflammatory disorders of ovary, fallopian tube and broad ligament: Secondary | ICD-10-CM

## 2017-11-21 DIAGNOSIS — D509 Iron deficiency anemia, unspecified: Secondary | ICD-10-CM

## 2017-11-21 DIAGNOSIS — N939 Abnormal uterine and vaginal bleeding, unspecified: Secondary | ICD-10-CM

## 2017-11-21 MED ORDER — MELOXICAM 15 MG PO TABS: 15.0000 mg | ORAL_TABLET | Freq: Every day | ORAL | 0 refills | Status: DC

## 2017-11-21 MED ORDER — LISINOPRIL 10 MG PO TABS: 10.0000 mg | ORAL_TABLET | Freq: Every day | ORAL | 3 refills | Status: DC

## 2017-11-21 MED ORDER — VITAMIN D (ERGOCALCIFEROL) 1.25 MG (50000 UNIT) PO CAPS: 50000.0000 [IU] | ORAL_CAPSULE | ORAL | 0 refills | Status: DC

## 2017-11-21 NOTE — Assessment & Plan Note (Signed)
Cardiac dysfunction.  Discussed icing regimen and home exercise.  Discussed which activities to do which wants to avoid.  Discussed posture and ergonomics.  Patient work with Product/process development scientist to learn core strengthening exercises.  Home exercises given and patient instructions as well.  Topical anti-inflammatories.  Oral anti-inflammatories.  Discussed icing regimen.  Discussed which activities to do which was to avoid.  Follow-up again in 4 to 6 weeks

## 2017-11-21 NOTE — Assessment & Plan Note (Signed)
Encourage weight loss. 

## 2017-11-21 NOTE — Patient Instructions (Addendum)
Good to see you  Ice 20 minutes 2 times daily. Usually after activity and before bed. Lisinopril 10 mg daily and sto the one with HCTZ Once weekly vitamin D for 12 weeks Melixcam daily for 10 days then when needed take it for 5 days straight Adjustable standing desk could help  Continue the good shoes.  Ferrous gluconate 65mg  with 500mg  of vitamin C 3 times a week  Exercises 3 times a week.   See me agai nin 406 weeks

## 2017-11-21 NOTE — Assessment & Plan Note (Signed)
Discussed I would complete a role with iron deficiency.  Discussed over-the-counter supplementation.  Discussed that this can help with endurance.

## 2017-11-21 NOTE — Assessment & Plan Note (Signed)
Once weekly supplementation given.

## 2017-11-22 ENCOUNTER — Telehealth: Payer: Self-pay

## 2017-11-22 ENCOUNTER — Telehealth: Payer: Self-pay | Admitting: *Deleted

## 2017-11-22 NOTE — Telephone Encounter (Signed)
Called and moved her appt to an earlier appt for 10/8

## 2017-11-22 NOTE — Telephone Encounter (Signed)
Told Ms Emma Greene that the ovarian cyst in the left ovary appears stable. The lining of the uterus appears slightly thick and Dr. Fermin Schwab recommends a biopsy of the lining. Pt agreeable to biopsy. Scheduled appointment with Dr. Fermin Schwab for biopsy on 12-06-17 at 1430.

## 2017-12-06 ENCOUNTER — Inpatient Hospital Stay: Payer: BLUE CROSS/BLUE SHIELD

## 2017-12-06 ENCOUNTER — Encounter: Payer: Self-pay | Admitting: Gynecology

## 2017-12-06 ENCOUNTER — Inpatient Hospital Stay: Payer: BLUE CROSS/BLUE SHIELD | Attending: Gynecology | Admitting: Gynecology

## 2017-12-06 VITALS — BP 141/71 | HR 81 | Temp 99.5°F | Resp 20 | Ht 64.0 in | Wt 329.5 lb

## 2017-12-06 DIAGNOSIS — M549 Dorsalgia, unspecified: Secondary | ICD-10-CM

## 2017-12-06 DIAGNOSIS — N838 Other noninflammatory disorders of ovary, fallopian tube and broad ligament: Secondary | ICD-10-CM

## 2017-12-06 DIAGNOSIS — R509 Fever, unspecified: Secondary | ICD-10-CM | POA: Diagnosis not present

## 2017-12-06 DIAGNOSIS — R9389 Abnormal findings on diagnostic imaging of other specified body structures: Secondary | ICD-10-CM | POA: Diagnosis not present

## 2017-12-06 DIAGNOSIS — R300 Dysuria: Secondary | ICD-10-CM

## 2017-12-06 DIAGNOSIS — N83209 Unspecified ovarian cyst, unspecified side: Secondary | ICD-10-CM | POA: Diagnosis not present

## 2017-12-06 DIAGNOSIS — Z87891 Personal history of nicotine dependence: Secondary | ICD-10-CM

## 2017-12-06 LAB — URINALYSIS, COMPLETE (UACMP) WITH MICROSCOPIC
Bilirubin Urine: NEGATIVE
Glucose, UA: NEGATIVE mg/dL
Hgb urine dipstick: NEGATIVE
Ketones, ur: NEGATIVE mg/dL
Leukocytes, UA: NEGATIVE
Nitrite: NEGATIVE
Protein, ur: NEGATIVE mg/dL
SPECIFIC GRAVITY, URINE: 1.027 (ref 1.005–1.030)
pH: 5 (ref 5.0–8.0)

## 2017-12-06 NOTE — Progress Notes (Signed)
Consult Note: Gyn-Onc   Emma Greene 52 y.o. female  Chief Complaint  Patient presents with  . Ovarian mass, left    Assessment : Thickened endometrial stripe.  Stable benign-appearing ovarian cysts.  Low-grade fever and low back pain rule out urinary tract infection.  Plan: The differential diagnosis of the thickened endometrial stripe was discussed with the patient and her husband.  Endometrial biopsies obtained.  Urine was sent for culture and sensitivity.  We will contact the patient with the reports from pathology and bacteriology and make recommendations accordingly.   Interval history: Patient returns for follow-up having undergone an ultrasound to evaluate her endometrial stripe given her irregular postmenopausal bleeding.  Endometrial stripe is 11 mm.  Patient reports that since her visit in August she has had some spotting but none in the last week or so.  She does complain of a "low-grade fever" and low back pain and some dysuria.  She denies any fever or chills.  HPI: 52 year old menopausal woman seen in consultation request of Dr. Abbie Sons regarding management of pelvic cysts.  In September 2018 the patient presented to urgent care and to the emergency room with acute abdominal pain which apparently resolved spontaneously.  However in the course of the workup to ovarian cysts were detected on CT scan.  A follow-up CT scan on January 24, 2017 noted "multiple small masses in the peritoneum the largest measuring 2.3 x 1.7 cm concerning for possible metastatic disease" (that CT scan is not available for review at the present time.  Patient had yet another CT scan on January 31, 2017 showing bilateral ovarian cyst measuring 2.5 on the right and 4.1 x 5.3 on the left these are similar to the cyst identified in September and there is no change.  The patient recently had a ultrasound of the pelvis showing a echo-free cystic mass on the right adnexal region measuring 4 x 3.7 cm.  On  the left are 2 cystic masses measuring 3.9 x 4.3 x 4.0 cm and a second measuring 3.8 x 2.7 x 3.1 cm.  In addition there is a tubular shaped cystic mass measuring 4.8 x 3.2 cm.  Ca1 25 is 10 units/mL.  The patient indicates that she does not feel any pelvic pain or pressure.  In May 2019 the patient had a CT scan showing all cysts were stable.  Patient had a pelvic ultrasound in August 2019 that showed stable cysts but endometrial stripe of 11 mm.  The patient was having some postmenopausal bleeding.    Review of Systems:10 point review of systems is negative except as noted in interval history.   Vitals: Blood pressure (!) 141/71, pulse 81, temperature 99.5 F (37.5 C), temperature source Oral, resp. rate 20, height 5\' 4"  (1.626 m), weight (!) 329 lb 8 oz (149.5 kg), SpO2 100 %.  Physical Exam: General : The patient is a healthy woman in no acute distress.  HEENT: normocephalic, extraoccular movements normal; neck is supple without thyromegally  Lynphnodes: Supraclavicular and inguinal nodes not enlarged  Abdomen: Obese, soft, non-tender, no ascites, no organomegally, no masses, no hernias  Pelvic:  EGBUS: Normal female  Vagina: Normal, no lesions  Urethra and Bladder: Normal, non-tender  Cervix: Normal, stenotic. Uterus: Difficult to evaluate given the patient's body habitus. Bi-manual examination: Non-tender; no adenxal masses or nodularity  Rectal: normal sphincter tone, no masses, no blood  Lower extremities: No edema or varicosities. Normal range of motion    Procedure note: Verbal informed consent  was obtained.  Timeout was taken.  The cervical os was stenotic.  Cervix is grasped with a single-tooth tenaculum and then dilated with an os finder area Pipelle aspiration was then accomplished on 2 passes obtain a small amount of tissue.  Patient tolerated the procedure well.     No Known Allergies  Past Medical History:  Diagnosis Date  . Allergy   . Anemia   . Family  history of adverse reaction to anesthesia    motehr- N/V   . GERD (gastroesophageal reflux disease)   . Heart murmur   . History of kidney stones   . Hypertension     Past Surgical History:  Procedure Laterality Date  . CHOLECYSTECTOMY    . HYSTEROSCOPY     with D&C  . OOPHORECTOMY    . right salpingo-oophorrectomy    . TONSILLECTOMY    . TUBAL LIGATION    . URETEROSCOPY WITH HOLMIUM LASER LITHOTRIPSY Left 08/03/2017   Procedure: URETEROSCOPY WITH HOLMIUM LASER LITHOTRIPSY/ STENT PLACEMENT;  Surgeon: Ceasar Mons, MD;  Location: [facility];  Service: Urology;  Laterality: Left;    Current Outpatient Medications  Medication Sig Dispense Refill  . ibuprofen (ADVIL,MOTRIN) 200 MG tablet Take 400 mg by mouth daily as needed for headache or moderate pain.    Marland Kitchen lisinopril (PRINIVIL,ZESTRIL) 10 MG tablet Take 1 tablet (10 mg total) by mouth daily. 30 tablet 3  . meloxicam (MOBIC) 15 MG tablet Take 1 tablet (15 mg total) by mouth daily. (Patient taking differently: Take 15 mg by mouth as needed. ) 90 tablet 0  . vitamin C (ASCORBIC ACID) 500 MG tablet Take 500 mg by mouth 3 (three) times a week.    . Vitamin D, Ergocalciferol, (DRISDOL) 50000 units CAPS capsule Take 1 capsule (50,000 Units total) by mouth every 7 (seven) days. 12 capsule 0   No current facility-administered medications for this visit.     Social History   Socioeconomic History  . Marital status: Divorced    Spouse name: Not on file  . Number of children: Not on file  . Years of education: Not on file  . Highest education level: Not on file  Occupational History  . Not on file  Social Needs  . Financial resource strain: Not on file  . Food insecurity:    Worry: Not on file    Inability: Not on file  . Transportation needs:    Medical: Not on file    Non-medical: Not on file  Tobacco Use  . Smoking status: Former Smoker    Last attempt to quit: 02/10/2015    Years since quitting: 2.8  . Smokeless  tobacco: Never Used  Substance and Sexual Activity  . Alcohol use: No    Alcohol/week: 0.0 standard drinks  . Drug use: No  . Sexual activity: Yes  Lifestyle  . Physical activity:    Days per week: Not on file    Minutes per session: Not on file  . Stress: Not on file  Relationships  . Social connections:    Talks on phone: Not on file    Gets together: Not on file    Attends religious service: Not on file    Active member of club or organization: Not on file    Attends meetings of clubs or organizations: Not on file    Relationship status: Not on file  . Intimate partner violence:    Fear of current or ex partner: Not on file  Emotionally abused: Not on file    Physically abused: Not on file    Forced sexual activity: Not on file  Other Topics Concern  . Not on file  Social History Narrative  . Not on file    Family History  Problem Relation Age of Onset  . Heart disease Father   . Hyperlipidemia Mother   . Hypertension Mother   . Cancer Maternal Grandfather        lung- smoker  . Hypertension Brother   . Heart disease Maternal Grandmother   . Hyperlipidemia Maternal Grandmother   . Hypertension Maternal Grandmother   . Heart disease Paternal Grandmother   . Heart disease Paternal Grandfather       Marti Sleigh, MD 12/06/2017, 12:38 PM

## 2017-12-06 NOTE — Patient Instructions (Signed)
We will contact you with the results of your urine sample from today and from your endometrial biopsy.  You may experience some cramping after the biopsy and you can use ibuprofen as directed. If the endometrial biopsy is negative, follow up can be planned with Dr. Phineas Real and Dr. Fermin Schwab if needed.   Endometrial Biopsy, Care After  This sheet gives you information about how to care for yourself after your procedure. Your health care provider may also give you more specific instructions. If you have problems or questions, contact your health care provider. What can I expect after the procedure? After the procedure, it is common to have:  Mild cramping.  A small amount of vaginal bleeding for a few days. This is normal.  Follow these instructions at home:  Take over-the-counter and prescription medicines only as told by your health care provider.  Do not douche, use tampons, or have sexual intercourse until your health care provider approves.  Return to your normal activities as told by your health care provider. Ask your health care provider what activities are safe for you.  Follow instructions from your health care provider about any activity restrictions, such as restrictions on strenuous exercise or heavy lifting. Contact a health care provider if:  You have heavy bleeding, or bleed for longer than 2 days after the procedure.  You have bad smelling discharge from your vagina.  You have a fever or chills.  You have a burning sensation when urinating or you have difficulty urinating.  You have severe pain in your lower abdomen. Get help right away if:  You have severe cramps in your stomach or back.  You pass large blood clots.  Your bleeding increases.  You become weak or light-headed, or you pass out. Summary  After the procedure, it is common to have mild cramping and a small amount of vaginal bleeding for a few days.  Do not douche, use tampons, or have  sexual intercourse until your health care provider approves.  Return to your normal activities as told by your health care provider. Ask your health care provider what activities are safe for you. This information is not intended to replace advice given to you by your health care provider. Make sure you discuss any questions you have with your health care provider. Document Released: 12/06/2012 Document Revised: 03/03/2016 Document Reviewed: 03/03/2016 Elsevier Interactive Patient Education  2017 [employer].

## 2017-12-07 ENCOUNTER — Telehealth: Payer: Self-pay | Admitting: Oncology

## 2017-12-07 ENCOUNTER — Telehealth: Payer: Self-pay | Admitting: *Deleted

## 2017-12-07 LAB — URINE CULTURE: Culture: NO GROWTH

## 2017-12-07 NOTE — Telephone Encounter (Signed)
Called patient back and advised her to follow up with her primary care doctor about the low grade fevers.  She verbalized understanding and agreement.

## 2017-12-07 NOTE — Telephone Encounter (Signed)
Call the patient to give her her results from her urine specimen.  Per Joylene John, NP, no infection on culture. Patient verbalized understanding.

## 2017-12-07 NOTE — Telephone Encounter (Signed)
Called Emma Greene and advised her that the endometrial biopsy showed no cancer or pre-cancer per Joylene John, NP.  Also let her know to follow up with her GYN doctor in 1 year.  She verbalized agreement and then asked if her urine culture results were back yet.  Advised her that it showed no growth.  She said she has been running low grade fevers and was thinking she may have a bladder infection.  Told her that I would check with Melissa and call her back.

## 2017-12-30 ENCOUNTER — Ambulatory Visit: Payer: BLUE CROSS/BLUE SHIELD | Admitting: Family Medicine

## 2018-01-18 NOTE — Progress Notes (Signed)
[facility] [address], [place] [address] Phone: [phone] Subjective:   [name], am serving as a scribe for Dr. Hulan Saas.   CC: Back pain follow-up  [id number]  Emma Greene is a 52 y.o. female coming in with complaint of back pain. Patient states that the left sided pain has resolved but now she is having pain on the right thoracic spine. She has pain with movement. Pain with lying down. Pain is intermittent but is sharp when she gets up out of the bed. Patient tried IBU and Mobic for her pain. Pain radiates from thoracis spine to below the right breast.  Patient has been trying to change some things recently.  Patient has been attempting to stand more at the desk and working out more.  Attempting to lose weight and is doing relatively well with this.  Patient wants to stay motivated but is concerned that this could aggravate the back more.  No radiation of the legs or any numbness or tingling or weakness.  Patient is concerned with her previous history and imaging showing different cysts.  Patient did have the recommendation to have CT scans but she would like to avoid it.  Patient is wondering if there is anything else she can do to make sure that some of her pain is not contributing from that.      Past Medical History:  Diagnosis Date  . Allergy   . Anemia   . Family history of adverse reaction to anesthesia    motehr- N/V   . GERD (gastroesophageal reflux disease)   . Heart murmur   . History of kidney stones   . Hypertension    Past Surgical History:  Procedure Laterality Date  . CHOLECYSTECTOMY    . HYSTEROSCOPY     with D&C  . OOPHORECTOMY    . right salpingo-oophorrectomy    . TONSILLECTOMY    . TUBAL LIGATION    . URETEROSCOPY WITH HOLMIUM LASER LITHOTRIPSY Left 08/03/2017   Procedure: URETEROSCOPY WITH HOLMIUM LASER LITHOTRIPSY/ STENT PLACEMENT;  Surgeon: Ceasar Mons, MD;  Location: [facility];   Service: Urology;  Laterality: Left;   Social History   Socioeconomic History  . Marital status: Divorced    Spouse name: Not on file  . Number of children: Not on file  . Years of education: Not on file  . Highest education level: Not on file  Occupational History  . Not on file  Social Needs  . Financial resource strain: Not on file  . Food insecurity:    Worry: Not on file    Inability: Not on file  . Transportation needs:    Medical: Not on file    Non-medical: Not on file  Tobacco Use  . Smoking status: Former Smoker    Last attempt to quit: 02/10/2015    Years since quitting: 2.9  . Smokeless tobacco: Never Used  Substance and Sexual Activity  . Alcohol use: No    Alcohol/week: 0.0 standard drinks  . Drug use: No  . Sexual activity: Yes  Lifestyle  . Physical activity:    Days per week: Not on file    Minutes per session: Not on file  . Stress: Not on file  Relationships  . Social connections:    Talks on phone: Not on file    Gets together: Not on file    Attends religious service: Not on file    Active member of  club or organization: Not on file    Attends meetings of clubs or organizations: Not on file    Relationship status: Not on file  Other Topics Concern  . Not on file  Social History Narrative  . Not on file   No Known Allergies Family History  Problem Relation Age of Onset  . Heart disease Father   . Hyperlipidemia Mother   . Hypertension Mother   . Cancer Maternal Grandfather        lung- smoker  . Hypertension Brother   . Heart disease Maternal Grandmother   . Hyperlipidemia Maternal Grandmother   . Hypertension Maternal Grandmother   . Heart disease Paternal Grandmother   . Heart disease Paternal Grandfather      Current Outpatient Medications (Cardiovascular):  .  lisinopril (PRINIVIL,ZESTRIL) 10 MG tablet, Take 1 tablet (10 mg total) by mouth daily.   Current Outpatient Medications (Analgesics):  .  ibuprofen (ADVIL,MOTRIN)  200 MG tablet, Take 400 mg by mouth daily as needed for headache or moderate pain. .  meloxicam (MOBIC) 15 MG tablet, Take 1 tablet (15 mg total) by mouth daily. (Patient taking differently: Take 15 mg by mouth as needed. )   Current Outpatient Medications (Other):  .  vitamin C (ASCORBIC ACID) 500 MG tablet, Take 500 mg by mouth 3 (three) times a week. .  Vitamin D, Ergocalciferol, (DRISDOL) 50000 units CAPS capsule, Take 1 capsule (50,000 Units total) by mouth every 7 (seven) days.    Past medical history, social, surgical and family history all reviewed in electronic medical record.  No pertanent information unless stated regarding to the chief complaint.   Review of Systems:  No headache, visual changes, nausea, vomiting, diarrhea, constipation, dizziness, abdominal pain, skin rash, fevers, chills, night sweats, weight loss, swollen lymph nodes,chest pain, shortness of breath, mood changes.  Positive muscle aches, body aches  Objective  Blood pressure 122/90, height 5\' 4"  (1.626 m), weight (!) 315 lb (142.9 kg). f    General: No apparent distress alert and oriented x3 mood and affect normal, dressed appropriately.  HEENT: Pupils equal, extraocular movements intact  Respiratory: Patient's speak in full sentences and does not appear short of breath  Cardiovascular: No lower extremity edema, non tender, no erythema  Skin: Warm dry intact with no signs of infection or rash on extremities or on axial skeleton.  Abdomen: Soft nontender overweight no masses appreciated Neuro: Cranial nerves II through XII are intact, neurovascularly intact in all extremities with 2+ DTRs and 2+ pulses.  Lymph: No lymphadenopathy of posterior or anterior cervical chain or axillae bilaterally.  Gait mild antalgic MSK:  Non tender with full range of motion and good stability and symmetric strength and tone of shoulders, elbows, wrist, hip, knee and ankles bilaterally.  Mild to moderate arthritic changes of  multiple joints  Patient back exam still has moderate to severe tenderness over the sacroiliac joint on the left side.  Mild increase in discomfort in the paraspinal musculature on the right side at the thoracolumbar juncture.  Tightness with Corky Sox bilaterally secondary to patient's body habitus.    Impression and Recommendations:    . The above documentation has been reviewed and is accurate and complete Emma Pulley, DO       Note: This dictation was prepared with Dragon dictation along with smaller phrase technology. Any transcriptional errors that result from this process are unintentional.

## 2018-01-19 ENCOUNTER — Ambulatory Visit (INDEPENDENT_AMBULATORY_CARE_PROVIDER_SITE_OTHER): Payer: BLUE CROSS/BLUE SHIELD | Admitting: Family Medicine

## 2018-01-19 ENCOUNTER — Other Ambulatory Visit (INDEPENDENT_AMBULATORY_CARE_PROVIDER_SITE_OTHER): Payer: BLUE CROSS/BLUE SHIELD

## 2018-01-19 ENCOUNTER — Encounter: Payer: Self-pay | Admitting: Family Medicine

## 2018-01-19 VITALS — BP 122/90 | Ht 64.0 in | Wt 315.0 lb

## 2018-01-19 DIAGNOSIS — M2550 Pain in unspecified joint: Secondary | ICD-10-CM | POA: Diagnosis not present

## 2018-01-19 DIAGNOSIS — M533 Sacrococcygeal disorders, not elsewhere classified: Secondary | ICD-10-CM

## 2018-01-19 LAB — HEPATIC FUNCTION PANEL
ALK PHOS: 63 U/L (ref 39–117)
ALT: 32 U/L (ref 0–35)
AST: 25 U/L (ref 0–37)
Albumin: 4 g/dL (ref 3.5–5.2)
BILIRUBIN DIRECT: 0.1 mg/dL (ref 0.0–0.3)
Total Bilirubin: 0.4 mg/dL (ref 0.2–1.2)
Total Protein: 7.4 g/dL (ref 6.0–8.3)

## 2018-01-19 LAB — LIPID PANEL
CHOL/HDL RATIO: 5
Cholesterol: 157 mg/dL (ref 0–200)
HDL: 34.8 mg/dL — ABNORMAL LOW (ref >39.00)
LDL Cholesterol: 98 mg/dL (ref 0–99)
NONHDL: 122.67
TRIGLYCERIDES: 123 mg/dL (ref 0.0–149.0)
VLDL: 24.6 mg/dL (ref 0.0–40.0)

## 2018-01-19 LAB — FERRITIN: FERRITIN: 60.9 ng/mL (ref 10.0–291.0)

## 2018-01-19 LAB — URIC ACID: Uric Acid, Serum: 5.5 mg/dL (ref 2.4–7.0)

## 2018-01-19 LAB — SEDIMENTATION RATE: Sed Rate: 76 mm/hr — ABNORMAL HIGH (ref 0–30)

## 2018-01-19 NOTE — Patient Instructions (Addendum)
Good to see you  You are doing great overall  Ice is your friend Continue the vitamins when you are done with the surgery  Keep working on the weight We will check labs today  Keep working at it  New exercises as well  Love the standing desk  See me again in 6-8 weeks

## 2018-01-19 NOTE — Assessment & Plan Note (Signed)
Patient is improving.  Has been doing more activity that I think is causing some mild muscle spasms.  Patient's past medical history is significant for hepatic cyst and ovarian cyst.  Also has had a history of dermoid cyst.  Patient is concerned because she was recommended to have CT scans of the abdomen and pelvis and does not want the radiation.  We discussed the possibility of labs that I think could benefit patient and see if there is any serial markers that could be contributing.  Encouraged her to talk to her gynecologist about the possibility of also ultrasound evaluations.  I believe some of this is causing the concern overall.  Patient will continue with the standing dose, home exercise, icing regimen.  Spent  25 minutes with patient face-to-face and had greater than 50% of counseling including as described above in assessment and plan.

## 2018-01-20 LAB — HEPATITIS PANEL, ACUTE
HEP B C IGM: NONREACTIVE
Hep A IgM: NONREACTIVE
Hepatitis B Surface Ag: NONREACTIVE
Hepatitis C Ab: NONREACTIVE
SIGNAL TO CUT-OFF: 0.13 (ref <1.00)

## 2018-01-20 LAB — CA 125: CA 125: 7 U/mL (ref <35)

## 2018-01-20 LAB — AFP TUMOR MARKER: AFP TUMOR MARKER: 2.6 ng/mL

## 2018-01-20 LAB — CANCER ANTIGEN 19-9: CA 19-9: 10 U/mL (ref <34)

## 2018-02-06 ENCOUNTER — Other Ambulatory Visit: Payer: Self-pay | Admitting: Family Medicine

## 2018-02-13 ENCOUNTER — Other Ambulatory Visit: Payer: Self-pay | Admitting: Family Medicine

## 2018-02-13 NOTE — Telephone Encounter (Signed)
Refill done.  

## 2018-02-14 ENCOUNTER — Other Ambulatory Visit: Payer: Self-pay | Admitting: Family Medicine

## 2018-03-10 ENCOUNTER — Ambulatory Visit: Payer: BLUE CROSS/BLUE SHIELD | Admitting: Family Medicine

## 2018-03-26 NOTE — Progress Notes (Signed)
[facility] [address] [place], [place] [address] Phone: [phone] Subjective:   [name], am serving as a scribe for Dr. Hulan Saas.   CC: Back pain, right knee pain  WCB:[id number]  Emma Greene is a 53 y.o. female coming in with complaint of back pain. Has pain intermittently. Did get a stand up desk at work and is trying to move more which helps her pain.  Been having sacroiliac dysfunction.  Is attempting to lose weight.  Has lost nearly 30 pounds.  Patient feels like her back is doing a little better.  Occasionally does the exercises.  No radiation down the legs  Patient also notes pain in her right knee pain. Pain when she rolled over in bed one month ago. Felt a pop and had sharp pain then pain went away. Pain is over patellar tendon and over the patella. Had a hard time walking last week due to pain.      Past Medical History:  Diagnosis Date  . Allergy   . Anemia   . Family history of adverse reaction to anesthesia    motehr- N/V   . GERD (gastroesophageal reflux disease)   . Heart murmur   . History of kidney stones   . Hypertension    Past Surgical History:  Procedure Laterality Date  . CHOLECYSTECTOMY    . HYSTEROSCOPY     with D&C  . OOPHORECTOMY    . right salpingo-oophorrectomy    . TONSILLECTOMY    . TUBAL LIGATION    . URETEROSCOPY WITH HOLMIUM LASER LITHOTRIPSY Left 08/03/2017   Procedure: URETEROSCOPY WITH HOLMIUM LASER LITHOTRIPSY/ STENT PLACEMENT;  Surgeon: Ceasar Mons, MD;  Location: [facility];  Service: Urology;  Laterality: Left;   Social History   Socioeconomic History  . Marital status: Divorced    Spouse name: Not on file  . Number of children: Not on file  . Years of education: Not on file  . Highest education level: Not on file  Occupational History  . Not on file  Social Needs  . Financial resource strain: Not on file  . Food insecurity:    Worry: Not on file    Inability: Not  on file  . Transportation needs:    Medical: Not on file    Non-medical: Not on file  Tobacco Use  . Smoking status: Former Smoker    Last attempt to quit: 02/10/2015    Years since quitting: 3.1  . Smokeless tobacco: Never Used  Substance and Sexual Activity  . Alcohol use: No    Alcohol/week: 0.0 standard drinks  . Drug use: No  . Sexual activity: Yes  Lifestyle  . Physical activity:    Days per week: Not on file    Minutes per session: Not on file  . Stress: Not on file  Relationships  . Social connections:    Talks on phone: Not on file    Gets together: Not on file    Attends religious service: Not on file    Active member of club or organization: Not on file    Attends meetings of clubs or organizations: Not on file    Relationship status: Not on file  Other Topics Concern  . Not on file  Social History Narrative  . Not on file   No Known Allergies Family History  Problem Relation Age of Onset  . Heart disease Father   . Hyperlipidemia Mother   .  Hypertension Mother   . Cancer Maternal Grandfather        lung- smoker  . Hypertension Brother   . Heart disease Maternal Grandmother   . Hyperlipidemia Maternal Grandmother   . Hypertension Maternal Grandmother   . Heart disease Paternal Grandmother   . Heart disease Paternal Grandfather      Current Outpatient Medications (Cardiovascular):  .  lisinopril (PRINIVIL,ZESTRIL) 10 MG tablet, TAKE 1 TABLET BY MOUTH EVERY DAY   Current Outpatient Medications (Analgesics):  .  ibuprofen (ADVIL,MOTRIN) 200 MG tablet, Take 400 mg by mouth daily as needed for headache or moderate pain. .  meloxicam (MOBIC) 15 MG tablet, TAKE 1 TABLET BY MOUTH EVERY DAY   Current Outpatient Medications (Other):  .  vitamin C (ASCORBIC ACID) 500 MG tablet, Take 500 mg by mouth 3 (three) times a week. .  Vitamin D, Ergocalciferol, (DRISDOL) 1.25 MG (50000 UT) CAPS capsule, TAKE ONE CAPSULE BY MOUTH ONCE WEEKLY    Past medical  history, social, surgical and family history all reviewed in electronic medical record.  No pertanent information unless stated regarding to the chief complaint.   Review of Systems:  No headache, visual changes, nausea, vomiting, diarrhea, constipation, dizziness, abdominal pain, skin rash, fevers, chills, night sweats, weight loss, swollen lymph nodes, body aches, joint swelling,  chest pain, shortness of breath, mood changes.  Positive muscle aches  Objective  Blood pressure 110/78, pulse 67, height 5\' 4"  (1.626 m), weight 300 lb (136.1 kg), SpO2 98 %.   General: No apparent distress alert and oriented x3 mood and affect normal, dressed appropriately.  HEENT: Pupils equal, extraocular movements intact  Respiratory: Patient's speak in full sentences and does not appear short of breath  Cardiovascular: No lower extremity edema, non tender, no erythema  Skin: Warm dry intact with no signs of infection or rash on extremities or on axial skeleton.  Abdomen: Soft nontender  Neuro: Cranial nerves II through XII are intact, neurovascularly intact in all extremities with 2+ DTRs and 2+ pulses.  Lymph: No lymphadenopathy of posterior or anterior cervical chain or axillae bilaterally.  Gait antalgic MSK:  Non tender with full range of motion and good stability and symmetric strength and tone of shoulders, elbows, wrist, hip, and ankles bilaterally.  Patient back exam does have loss of lordosis.  Still some tenderness over the right sacroiliac joint.  Positive Corky Sox mildly on the right.  Patient does have a body habitus that makes it difficult to do the [facility] test.  Negative straight leg test.  Neurovascular intact with 5 out of 5 strength of the lower extremities  Knee: Right valgus deformity noted. Large thigh to calf ratio.  Tender to palpation over medial and PF joint line.  ROM full in flexion and extension and lower leg rotation. instability with valgus force.  painful patellar  compression. Patellar glide with moderate crepitus.  Lateral tracking of the patella noted Patellar and quadriceps tendons unremarkable. Hamstring and quadriceps strength is normal. Contralateral knee shows arthritic changes but no significant instability and mild pain  [id number]; 15 additional minutes spent for Therapeutic exercises as stated in above notes.  This included exercises focusing on stretching, strengthening, with significant focus on eccentric aspects.   Long term goals include an improvement in range of motion, strength, endurance as well as avoiding reinjury. Patient's frequency would include in 1-2 times a day, 3-5 times a week for a duration of 6-12 weeks.  Patellofemoral Syndrome  Reviewed anatomy using anatomical model and how  PFS occurs.  Given rehab exercises handout for VMO, hip abductors, core, entire kinetic chain including proprioception exercises including cone touches, step downs, hip elevations and turn outs.  Could benefit from PT, regular exercise, upright biking, and a PFS knee brace to assist with tracking abnormalities. Proper technique shown and discussed handout in great detail with ATC.  All questions were discussed and answered.     Impression and Recommendations:     This case required medical decision making of moderate complexity. The above documentation has been reviewed and is accurate and complete Lyndal Pulley, DO       Note: This dictation was prepared with Dragon dictation along with smaller phrase technology. Any transcriptional errors that result from this process are unintentional.

## 2018-03-27 ENCOUNTER — Encounter: Payer: Self-pay | Admitting: Family Medicine

## 2018-03-27 ENCOUNTER — Ambulatory Visit: Payer: BLUE CROSS/BLUE SHIELD | Admitting: Family Medicine

## 2018-03-27 DIAGNOSIS — M1711 Unilateral primary osteoarthritis, right knee: Secondary | ICD-10-CM | POA: Diagnosis not present

## 2018-03-27 DIAGNOSIS — M533 Sacrococcygeal disorders, not elsewhere classified: Secondary | ICD-10-CM

## 2018-03-27 NOTE — Assessment & Plan Note (Signed)
Stable.  Patient is making some progress.  Encouraged her to be a little more religious on doing the exercises on a regular basis.  Encouraged weight loss.  Patient denied any type of radicular symptoms at this time.  Continue conservative therapy.  Follow-up in 2 months

## 2018-03-27 NOTE — Assessment & Plan Note (Signed)
Arthritis noted.  Discussed icing regimen and home exercise.  Discussed topical anti-inflammatories.  Patient may need custom bracing but patient declined that at the moment.  Work with Product/process development scientist.  Worsening symptoms consider injections

## 2018-03-27 NOTE — Patient Instructions (Signed)
Good to see you  I think you are doing well overall  Exercises 3 times a week.  Ice 20 minutes 2 times daily. Usually after activity and before bed. pennsaid pinkie amount topically 2 times daily as needed.  Biking would be great for the knee.  See me again in 6-8 weeks

## 2018-06-02 ENCOUNTER — Ambulatory Visit: Payer: BLUE CROSS/BLUE SHIELD | Admitting: Family Medicine

## 2018-07-03 ENCOUNTER — Ambulatory Visit: Payer: BLUE CROSS/BLUE SHIELD | Admitting: Family Medicine

## 2018-09-29 IMAGING — CT CT ABD-PELV W/ CM
2 of 5 series · 16 of 46 positions shown, 18 images · IV contrast (APPLIED)
Comparison: 04/26/2017 and 01/31/2017

CLINICAL DATA: Left flank pain and hematuria for 3 days. Chronic
right upper quadrant pain and intermittent nausea. Followup adnexal
and peritoneal cystic lesions. Previous right salpingo-oophorectomy.

EXAM:
CT ABDOMEN AND PELVIS WITH CONTRAST
TECHNIQUE: Multidetector CT imaging of the abdomen and pelvis was performed
using the standard protocol following bolus administration of
intravenous contrast.
CONTRAST:  100mL [id number] IOPAMIDOL ([id number]) INJECTION 61%

[Series 2: axial st · axial · 0.98mm/px · z∈[-449,-19]mm · 13 of 100 slices shown, 15 images]
[im 7/100  soft-tissue]
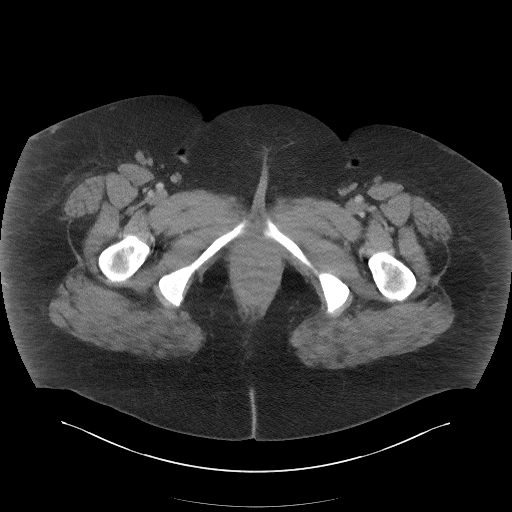
[im 7/100  bone]
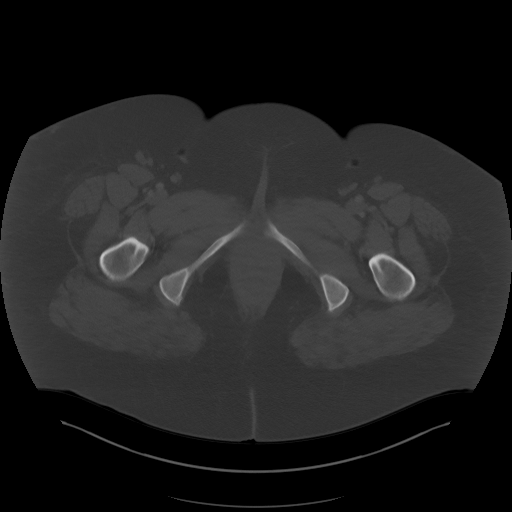
[im 13/100  soft-tissue]
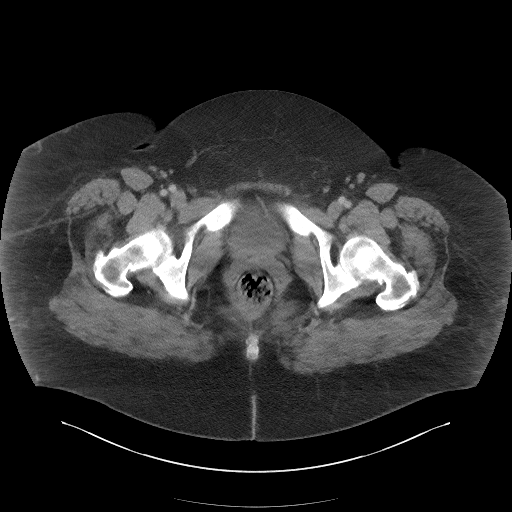
[im 19/100  soft-tissue]
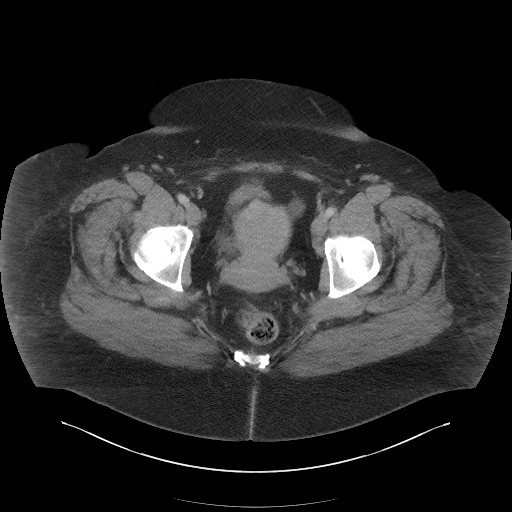
[im 31/100  soft-tissue]
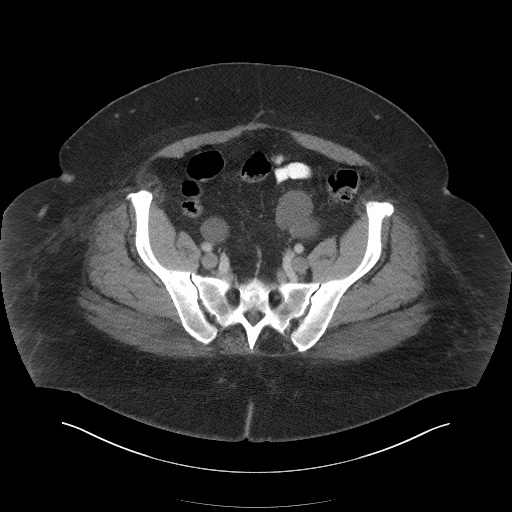
[im 38/100  soft-tissue]
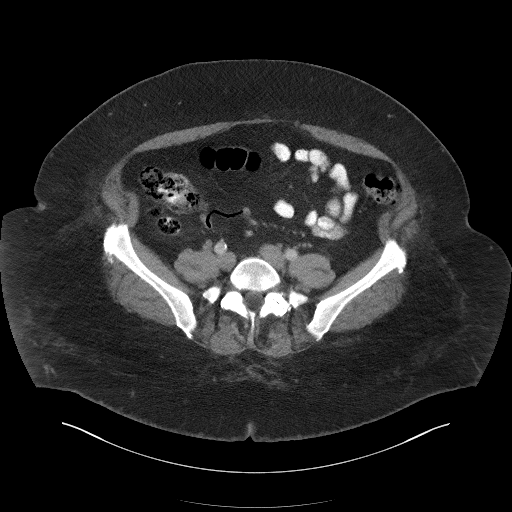
[im 44/100  soft-tissue]
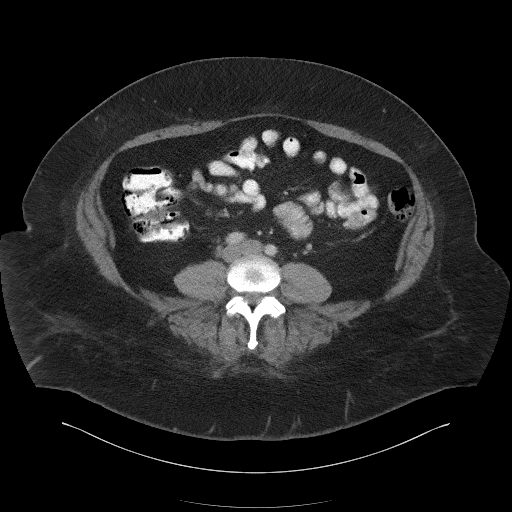
[im 50/100  soft-tissue]
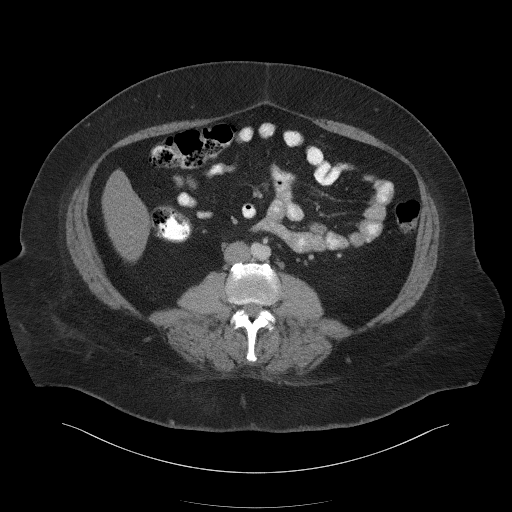
[im 56/100  soft-tissue]
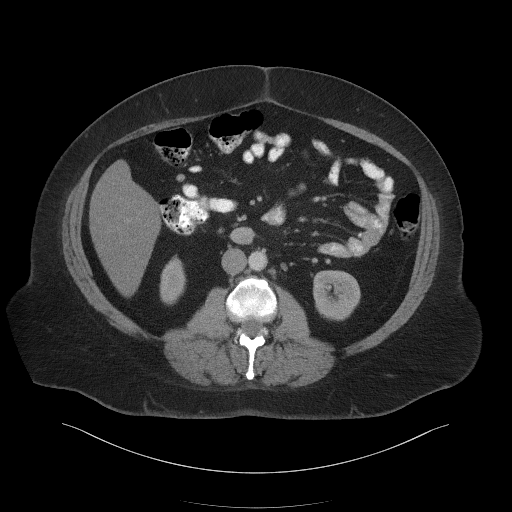
[im 62/100  soft-tissue]
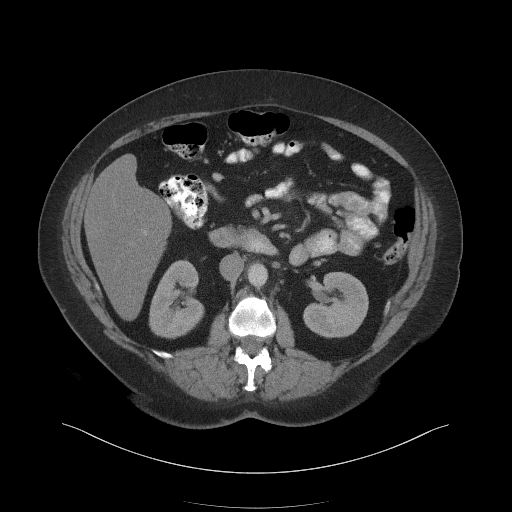
[im 62/100  bone]
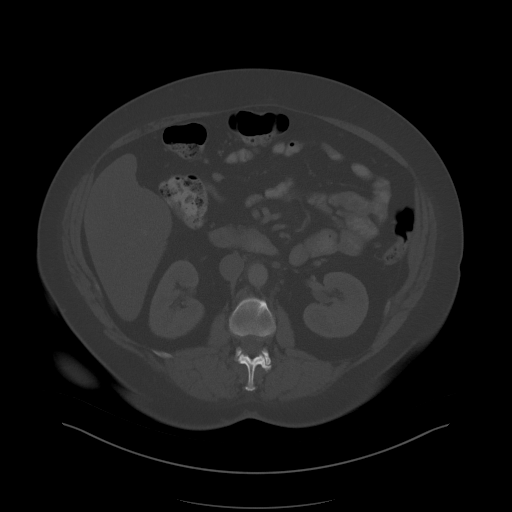
[im 69/100  soft-tissue]
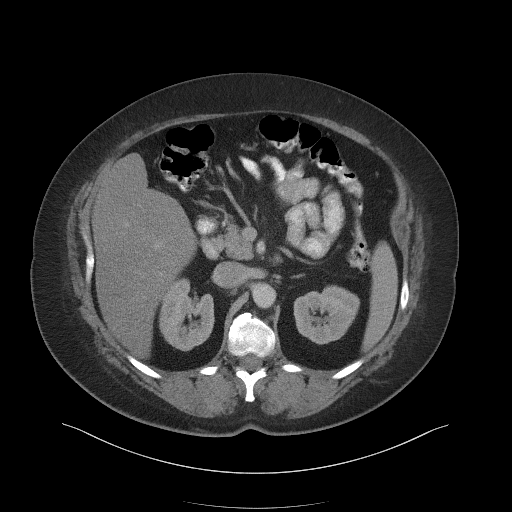
[im 81/100  soft-tissue]
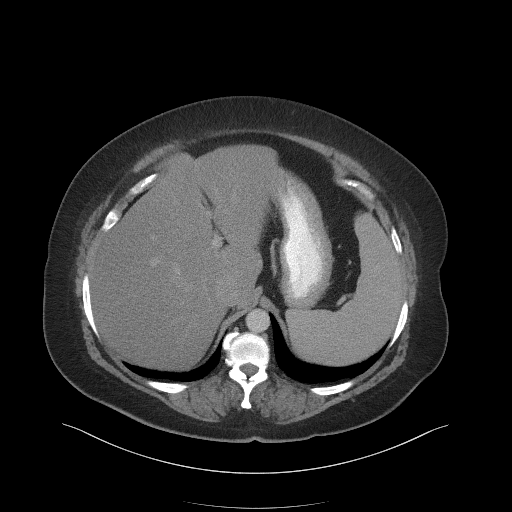
[im 87/100  soft-tissue]
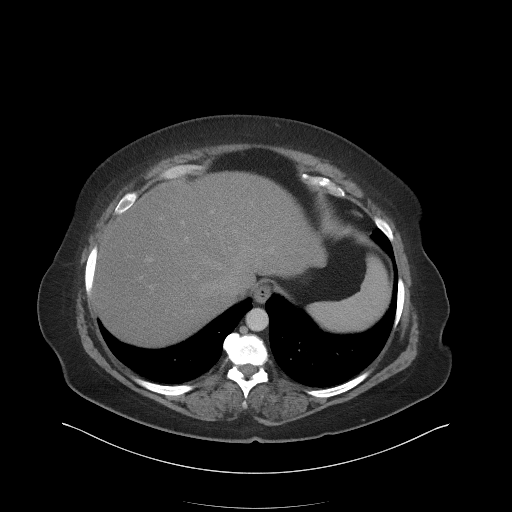
[im 93/100  soft-tissue]
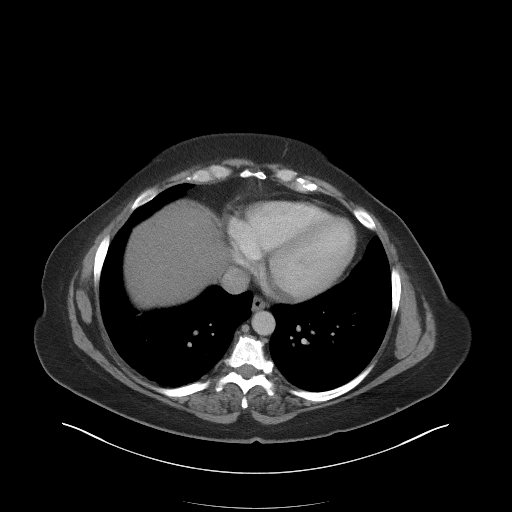

[Series 5: coronal st · coronal · 0.85mm/px · 3 of 121 slices shown]
[im 41/121  soft-tissue]
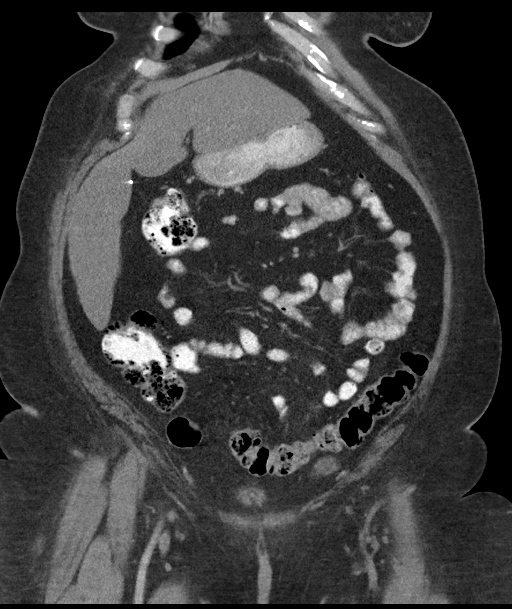
[im 54/121  soft-tissue]
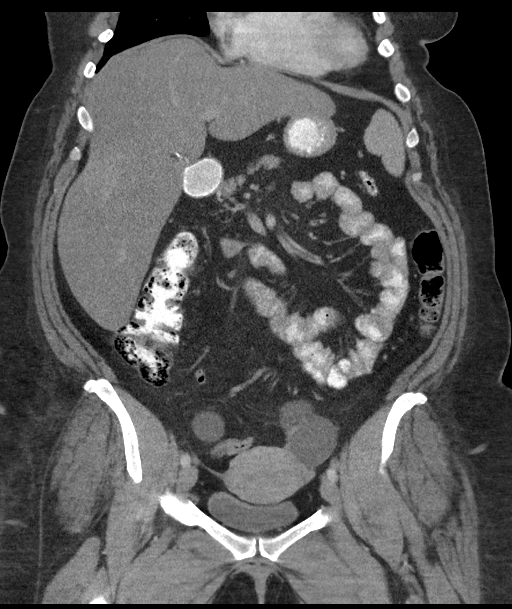
[im 67/121  soft-tissue]
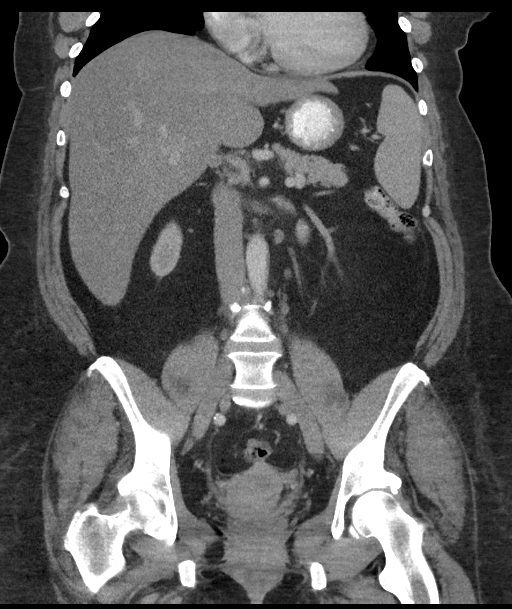

[16 of 46 positions shown; findings below may reference images not displayed]

FINDINGS: Lower Chest: No acute findings.

Hepatobiliary: No hepatic masses identified. Stable moderate hepatic
steatosis. Prior cholecystectomy. No evidence of biliary
obstruction.

Pancreas:  No mass or inflammatory changes.

Spleen: Within normal limits in size and appearance.

Adrenals/Urinary Tract: No masses identified. 6 mm calculus now seen
within the left renal pelvis. No evidence of hydronephrosis. No
other urinary calculi identified.

Stomach/Bowel: No evidence of obstruction, inflammatory process or
abnormal fluid collections. Normal appendix visualized.

Vascular/Lymphatic: No pathologically enlarged lymph nodes. No
abdominal aortic aneurysm.

Reproductive: Normal appearance of uterus. A multilocular cystic
lesion is again seen in the left adnexa, which is not significantly
changed in size or appearance since prior study. This measures 6.9 x
4.0 cm on image 75/2, compared to 7.2 x 4.0 cm previously. A simple
appearing cyst in the right adnexa is also stable, measuring 2.9 x
2.8 cm on image 71/2.

Other: Several small cystic lesions in the right upper quadrant
omentum/peritoneum are seen, largest anterior to the falciform
ligament measuring 2.6 cm. These remains stable since previous
study. No new peritoneal nodules or masses identified. No evidence
of ascites.

Musculoskeletal:  No suspicious bone lesions identified.
IMPRESSION: Continued stability of complex cystic lesion in the left adnexa, and
smaller simple appearing cyst in right adnexa.

Stable small cystic lesions in right upper quadrant
omental/peritoneal fat.

No new or progressive disease.  No evidence of ascites.

6 mm calculus now seen in left renal pelvis. No evidence of
hydronephrosis.

Stable hepatic steatosis.

## 2019-03-06 ENCOUNTER — Telehealth: Payer: Self-pay | Admitting: *Deleted

## 2019-03-06 NOTE — Telephone Encounter (Signed)
Patient called currently lives in [place] and called stating she had abnormal mammogram and radiologist recommended a breast biopsy asked is she needs to follow up with GYN oncology for this or the radiology center in [place]. I told her the center in [place] will handle this for her and to schedule. Patient is aware she is overdue for annual exam and will call our office back to schedule.

## 2020-07-15 IMAGING — US US PELVIS COMPLETE TRANSABD/TRANSVAG
1 series · 13 of 25 positions shown · non-contrast
Comparison: CT on 07/26/2017 and 04/26/2017

CLINICAL DATA: Postmenopausal bleeding. Followup bilateral adnexal
cystic lesions.

EXAM:
TRANSABDOMINAL AND TRANSVAGINAL ULTRASOUND OF PELVIS
TECHNIQUE: Both transabdominal and transvaginal ultrasound examinations of the
pelvis were performed. Transabdominal technique was performed for
global imaging of the pelvis including uterus, ovaries, adnexal
regions, and pelvic cul-de-sac. It was necessary to proceed with
endovaginal exam following the transabdominal exam to visualize the
endometrial stripe and ovaries.

[Series 1: us pelvis complete transabd/transvag · 0.25mm/px · 13 of 116 slices shown]
[im 1/116]
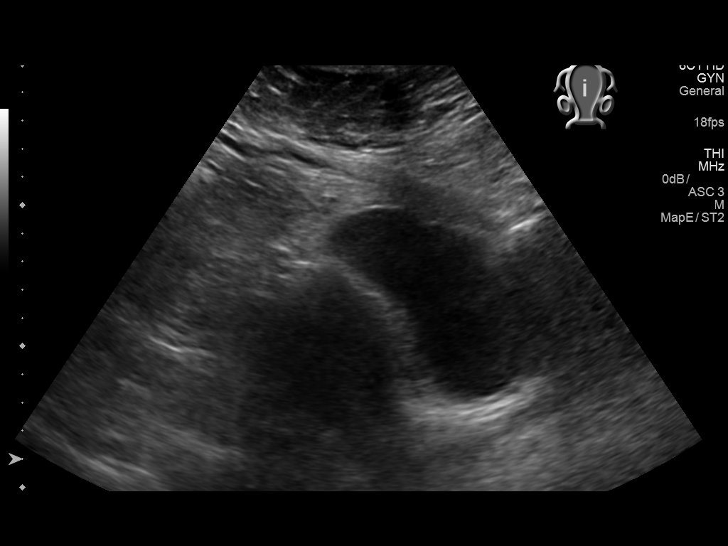
[im 10/116]
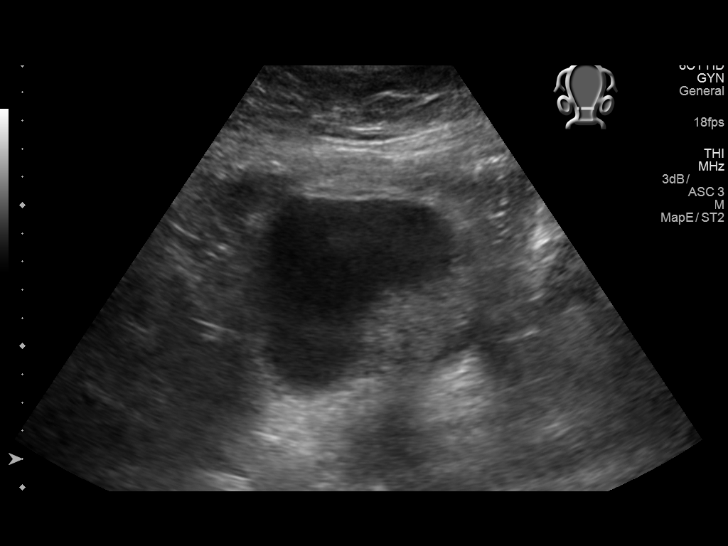
[im 20/116]
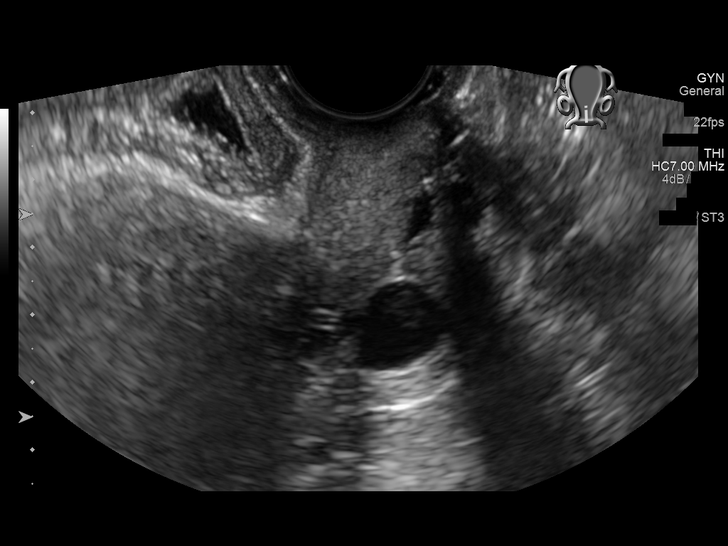
[im 29/116]
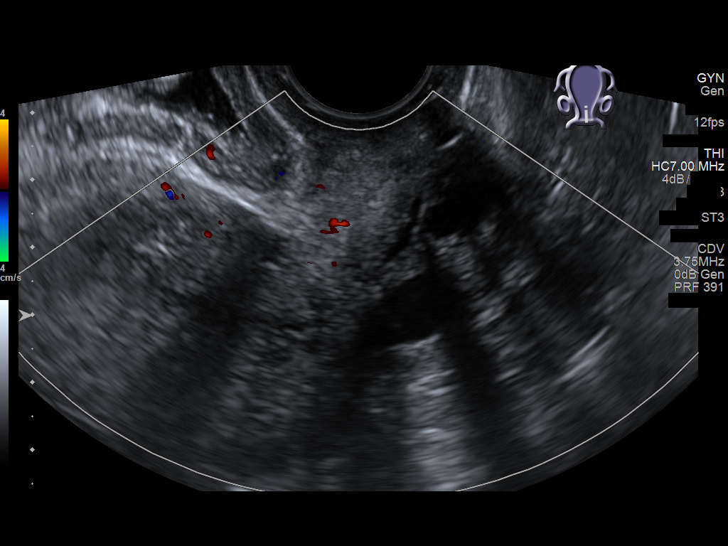
[im 39/116]
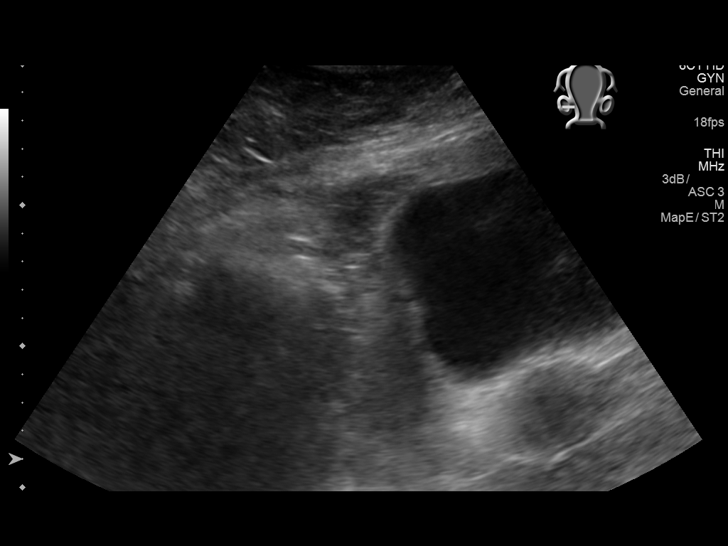
[im 48/116]
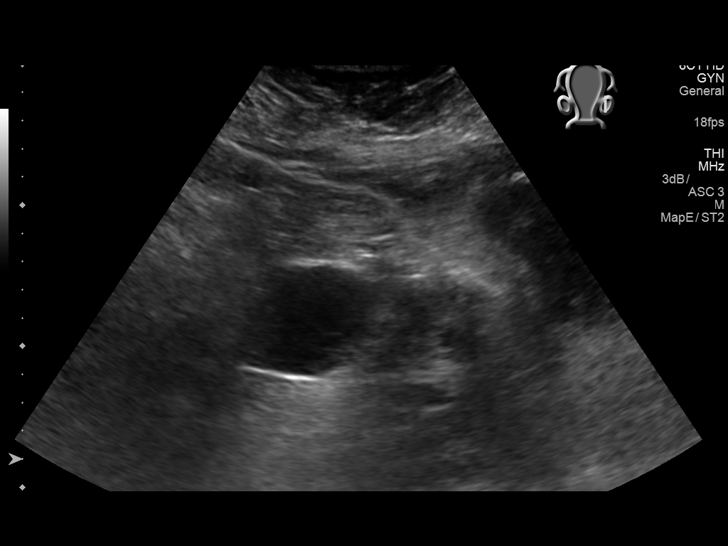
[im 58/116]
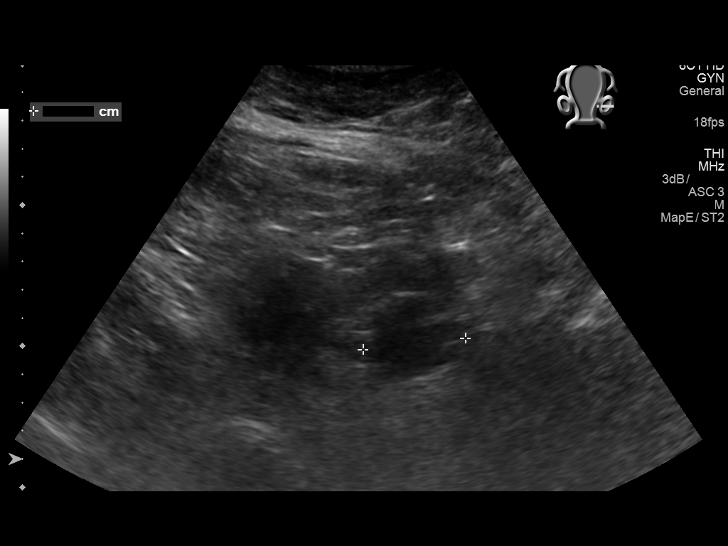
[im 68/116]
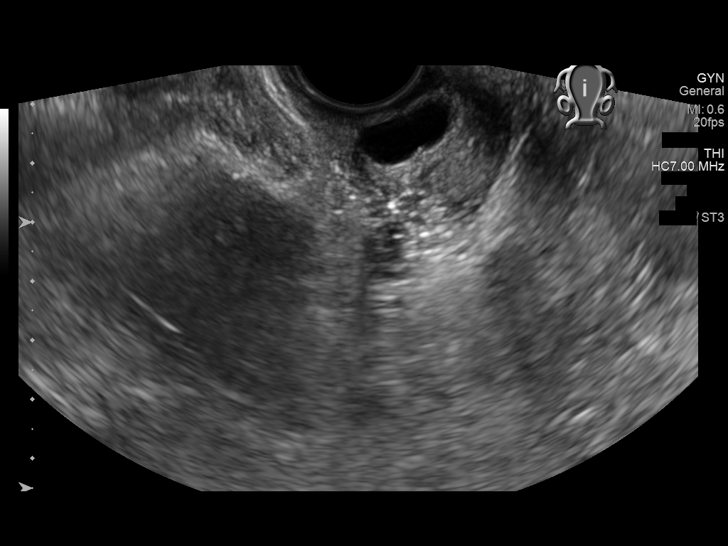
[im 77/116]
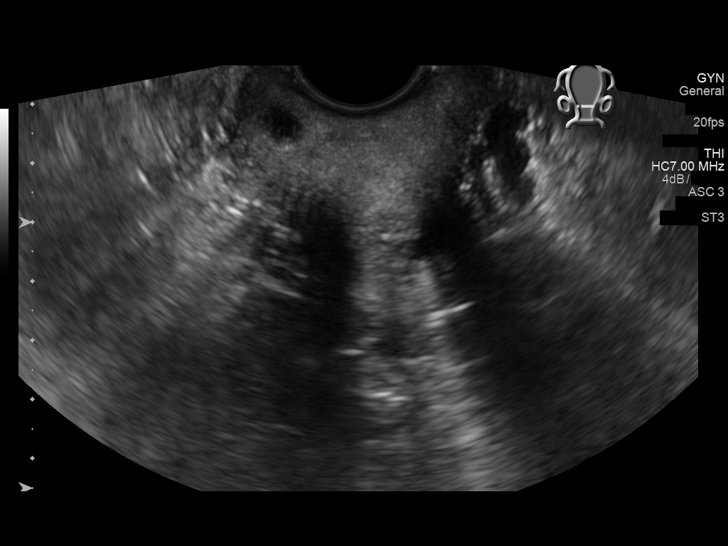
[im 87/116]
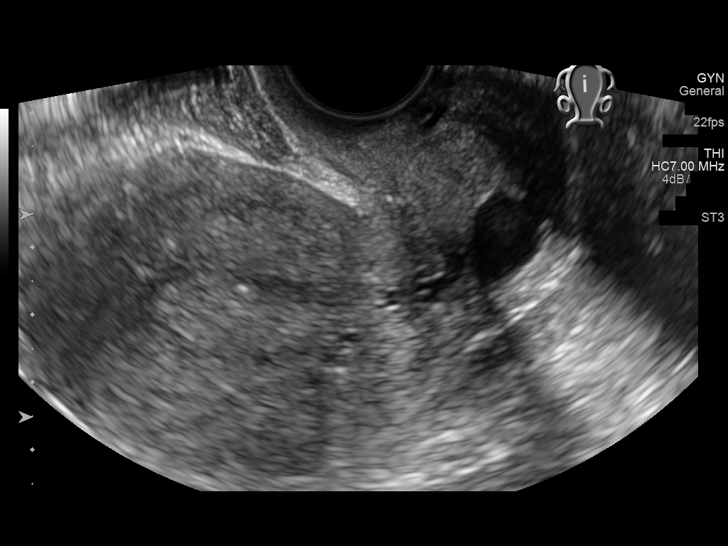
[im 96/116]
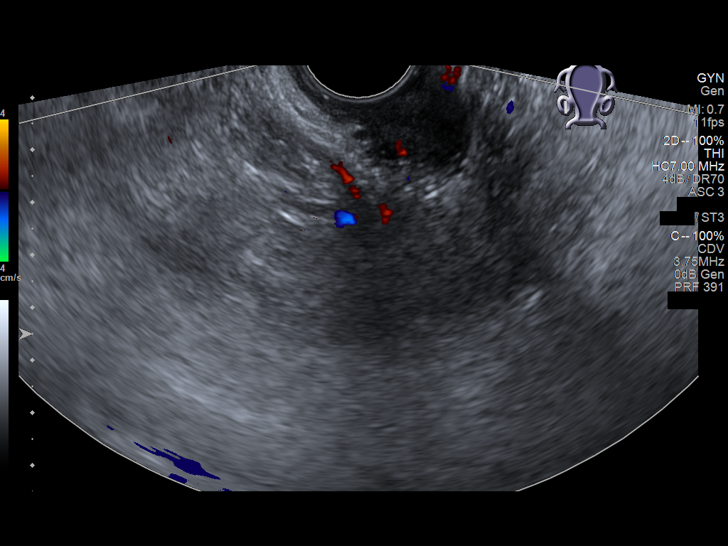
[im 106/116]
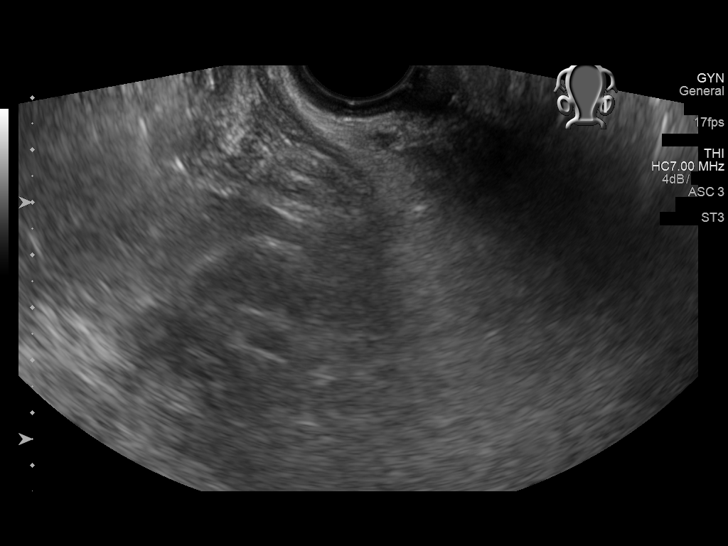
[im 116/116]
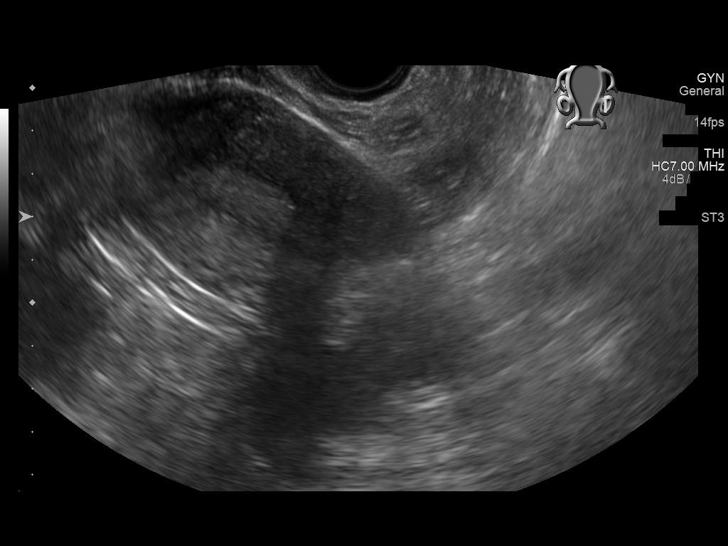

[13 of 25 positions shown; findings below may reference images not displayed]

FINDINGS: Uterus

Measurements: 8.5 x 4.5 x 6.7 cm. No fibroids or other mass
visualized. Several cervical nabothian cysts are noted.

Endometrium

Thickness: 11 mm.  No focal abnormality visualized.

Right ovary

Measurements: Prior history of right oophorectomy. A simple
appearing cyst is seen in the right adnexa which measures 3.0 cm in
diameter, without significant change since previous study. This has
benign sonographic characteristics.

Left ovary

Measurements: Not well visualized. A multiloculated cystic lesion is
seen in the left adnexa which contains several thin internal
septations. This measures approximately 7.4 x 4.9 cm, without
significant change compared to prior CTs. This has indeterminate but
probably benign characteristics, and differential diagnosis includes
hydrosalpinx and cystic ovarian neoplasm.

Other findings

No abnormal free fluid.
IMPRESSION: Endometrial thickness measures 11 mm. In the setting of
post-menopausal bleeding, endometrial sampling is indicated to
exclude carcinoma. If results are benign, sonohysterogram should be
considered for focal lesion work-up. (Ref: Radiological Reasoning:
Algorithmic Workup of Abnormal Vaginal Bleeding with Endovaginal
Sonography and Sonohysterography. AJR 0882; 191:S68-73).

7 x 5 mm multiloculated cystic lesion in left adnexa, without
significant change since previous study. This has indeterminate but
probably benign characteristics, and differential diagnosis includes
hydrosalpinx and cystic ovarian neoplasm. Suggest correlation with
tumor markers, and consider surgical evaluation versus continued
follow-up by imaging.

Stable 3 cm simple appearing cyst in right adnexa, with benign
characteristics.
